# Patient Record
Sex: Female | Born: 1965 | Race: White | Hispanic: No | Marital: Married | Smoking: Never smoker
Health system: Southern US, Community
[De-identification: ages and names within clinical notes are randomized; demographics above are authoritative.]

## PROBLEM LIST (undated history)

## (undated) DIAGNOSIS — I10 Essential (primary) hypertension: Secondary | ICD-10-CM

## (undated) DIAGNOSIS — E785 Hyperlipidemia, unspecified: Secondary | ICD-10-CM

## (undated) DIAGNOSIS — E119 Type 2 diabetes mellitus without complications: Secondary | ICD-10-CM

## (undated) HISTORY — PX: CHOLECYSTECTOMY: SHX55

## (undated) HISTORY — DX: Type 2 diabetes mellitus without complications: E11.9

## (undated) HISTORY — PX: OTHER SURGICAL HISTORY: SHX169

## (undated) HISTORY — DX: Essential (primary) hypertension: I10

## (undated) HISTORY — DX: Hyperlipidemia, unspecified: E78.5

---

## 1999-10-21 ENCOUNTER — Other Ambulatory Visit: Admission: RE | Admit: 1999-10-21 | Discharge: 1999-10-21 | Payer: Self-pay | Admitting: Obstetrics and Gynecology

## 2000-11-20 ENCOUNTER — Other Ambulatory Visit: Admission: RE | Admit: 2000-11-20 | Discharge: 2000-11-20 | Payer: Self-pay | Admitting: Obstetrics and Gynecology

## 2001-09-11 ENCOUNTER — Encounter: Admission: RE | Admit: 2001-09-11 | Discharge: 2001-12-10 | Payer: Self-pay | Admitting: Obstetrics and Gynecology

## 2001-11-26 ENCOUNTER — Inpatient Hospital Stay (HOSPITAL_COMMUNITY): Admission: AD | Admit: 2001-11-26 | Discharge: 2001-11-28 | Payer: Self-pay | Admitting: Obstetrics & Gynecology

## 2001-12-24 ENCOUNTER — Other Ambulatory Visit: Admission: RE | Admit: 2001-12-24 | Discharge: 2001-12-24 | Payer: Self-pay | Admitting: Obstetrics & Gynecology

## 2002-12-31 ENCOUNTER — Other Ambulatory Visit: Admission: RE | Admit: 2002-12-31 | Discharge: 2002-12-31 | Payer: Self-pay | Admitting: Obstetrics and Gynecology

## 2004-01-06 ENCOUNTER — Other Ambulatory Visit: Admission: RE | Admit: 2004-01-06 | Discharge: 2004-01-06 | Payer: Self-pay | Admitting: Obstetrics and Gynecology

## 2004-01-21 ENCOUNTER — Other Ambulatory Visit: Admission: RE | Admit: 2004-01-21 | Discharge: 2004-01-21 | Payer: Self-pay | Admitting: Obstetrics and Gynecology

## 2004-05-12 ENCOUNTER — Other Ambulatory Visit: Admission: RE | Admit: 2004-05-12 | Discharge: 2004-05-12 | Payer: Self-pay | Admitting: Obstetrics and Gynecology

## 2005-02-01 ENCOUNTER — Other Ambulatory Visit: Admission: RE | Admit: 2005-02-01 | Discharge: 2005-02-01 | Payer: Self-pay | Admitting: Obstetrics and Gynecology

## 2006-03-18 LAB — HM MAMMOGRAPHY: HM Mammogram: NORMAL

## 2006-03-20 LAB — HM PAP SMEAR

## 2007-12-12 ENCOUNTER — Encounter: Admission: RE | Admit: 2007-12-12 | Discharge: 2007-12-21 | Payer: Self-pay | Admitting: Orthopedic Surgery

## 2010-01-18 LAB — HM DIABETES EYE EXAM

## 2011-03-21 ENCOUNTER — Encounter: Payer: Self-pay | Admitting: Physician Assistant

## 2011-03-21 DIAGNOSIS — E119 Type 2 diabetes mellitus without complications: Secondary | ICD-10-CM | POA: Insufficient documentation

## 2011-03-21 DIAGNOSIS — I10 Essential (primary) hypertension: Secondary | ICD-10-CM

## 2011-03-21 DIAGNOSIS — E785 Hyperlipidemia, unspecified: Secondary | ICD-10-CM

## 2011-05-17 ENCOUNTER — Encounter (HOSPITAL_COMMUNITY)
Admission: RE | Admit: 2011-05-17 | Discharge: 2011-05-17 | Disposition: A | Discharge status: Home / Self Care | Payer: BC Managed Care – PPO | Source: Ambulatory Visit | Attending: Obstetrics and Gynecology | Admitting: Obstetrics and Gynecology

## 2011-05-17 LAB — CBC
MCH: 27.1 pg (ref 26.0–34.0)
MCHC: 32 g/dL (ref 30.0–36.0)
MCV: 84.8 fL (ref 78.0–100.0)
Platelets: 247 10*3/uL (ref 150–400)
RBC: 4.35 MIL/uL (ref 3.87–5.11)
RDW: 13.6 % (ref 11.5–15.5)

## 2011-05-17 LAB — COMPREHENSIVE METABOLIC PANEL
ALT: 17 U/L (ref 0–35)
AST: 14 U/L (ref 0–37)
Calcium: 9.5 mg/dL (ref 8.4–10.5)
Creatinine, Ser: 0.7 mg/dL (ref 0.50–1.10)
Sodium: 136 mEq/L (ref 135–145)
Total Protein: 7.2 g/dL (ref 6.0–8.3)

## 2011-05-23 ENCOUNTER — Ambulatory Visit (HOSPITAL_COMMUNITY)
Admission: EM | Admit: 2011-05-23 | Discharge: 2011-05-23 | Disposition: A | Discharge status: Home / Self Care | Payer: BC Managed Care – PPO | Source: Ambulatory Visit | Attending: Obstetrics and Gynecology | Admitting: Obstetrics and Gynecology

## 2011-05-23 ENCOUNTER — Other Ambulatory Visit: Payer: Self-pay | Admitting: Obstetrics and Gynecology

## 2011-05-23 DIAGNOSIS — Z01812 Encounter for preprocedural laboratory examination: Secondary | ICD-10-CM | POA: Insufficient documentation

## 2011-05-23 DIAGNOSIS — Z01818 Encounter for other preprocedural examination: Secondary | ICD-10-CM | POA: Insufficient documentation

## 2011-05-23 DIAGNOSIS — N920 Excessive and frequent menstruation with regular cycle: Secondary | ICD-10-CM | POA: Insufficient documentation

## 2011-05-23 DIAGNOSIS — N840 Polyp of corpus uteri: Secondary | ICD-10-CM | POA: Insufficient documentation

## 2011-05-23 LAB — GLUCOSE, CAPILLARY
Glucose-Capillary: 156 mg/dL — ABNORMAL HIGH (ref 70–99)
Glucose-Capillary: 154 mg/dL — ABNORMAL HIGH (ref 70–99)

## 2011-06-01 NOTE — Op Note (Signed)
  [name], Debbie Hunt NO.:  [id number]  MEDICAL RECORD NO.:  [id number]  LOCATION:                                 FACILITY:  PHYSICIAN:  Malva Limes, M.D.    DATE OF BIRTH:  10-26-1966  DATE OF PROCEDURE:  05/23/2011 DATE OF DISCHARGE:                              OPERATIVE REPORT   PREOPERATIVE DIAGNOSES: 1. Uterine polyp. 2. Menorrhagia.  POSTOPERATIVE DIAGNOSES: 1. Uterine polyp. 2. Menorrhagia.  PROCEDURE: 1. Hysteroscopy. 2. Dilation and curettage. 3. NovaSure endometrial ablation.  SURGEON:  Malva Limes, MD  ANESTHESIA:  General and local.  ANTIBIOTIC:  Ancef 1 gram.  DRAINS:  Red rubber catheter bladder.  SPECIMENS:  Endometrial curettings sent to Pathology.  COMPLICATIONS:  None.  ESTIMATED BLOOD LOSS:  Minimal.  FINDINGS:  The patient had normal ostia bilaterally.  She had a large broad-based polyp on the anterior surface of the uterus.  PROCEDURE:  The patient was taken to the operating room where general anesthetic was administered without difficulty.  She was placed in dorsal lithotomy position.  She was prepped and draped in usual fashion for this procedure.  Her bladder was drained with a red rubber catheter. A sterile speculum was placed in the vagina.  A 20 mL of 1% lidocaine was used for paracervical block.  The cervix was grasped with single- tooth tenaculum and sounded to 11 cm.  The hysteroscope was advanced through the cervix and into the uterine cavity. The cavity appeared to be normal except for the large polyp on the anterior wall of the uterus. At this point, the hysteroscope was removed and sharp curettage was performed with a copious amount of tissue being removed and sent to Pathology.  Following this, the NovaSure device was placed into uterine cavity.  The width was 4.3 cm, the length 6.5 cm.  A seal test was performed and passed.  The device was then turned on for 1 minute.  The patient tolerated the  procedure well.  The device was removed.  She was awakened and taken to recovery room in stable condition.  She was discharged to home.  She was instructed to follow up in the office in 4 weeks.  She was sent home with Vicodin, __________, Diflucan for yeast infection and told to use Advil as needed.          ______________________________ Malva Limes, M.D.     MA/MEDQ  D:  05/23/2011  T:  05/23/2011  Job:  [id number]  Electronically Signed by Malva Limes M.D. on 06/01/2011 08:49:35 PM

## 2012-03-28 ENCOUNTER — Other Ambulatory Visit: Payer: Self-pay | Admitting: Obstetrics and Gynecology

## 2012-06-05 ENCOUNTER — Other Ambulatory Visit: Payer: Self-pay | Admitting: Surgery

## 2012-11-09 ENCOUNTER — Other Ambulatory Visit: Payer: Self-pay | Admitting: *Deleted

## 2012-11-09 DIAGNOSIS — R011 Cardiac murmur, unspecified: Secondary | ICD-10-CM

## 2012-11-15 ENCOUNTER — Other Ambulatory Visit (HOSPITAL_COMMUNITY): Payer: BC Managed Care – PPO

## 2013-02-26 ENCOUNTER — Telehealth: Payer: Self-pay | Admitting: Nurse Practitioner

## 2013-02-26 ENCOUNTER — Other Ambulatory Visit: Payer: Self-pay | Admitting: *Deleted

## 2013-02-26 NOTE — Telephone Encounter (Signed)
Need to know PHarmacy

## 2013-02-26 NOTE — Telephone Encounter (Signed)
Last office visit by ACM on 03-26-12. Rx last filled on 12-15-11. Please advise. Thank you

## 2013-02-27 MED ORDER — HYDROCORTISONE ACETATE 25 MG RE SUPP: 25.0000 mg | Freq: Two times a day (BID) | RECTAL | Status: DC | PRN

## 2013-02-27 NOTE — Telephone Encounter (Signed)
Pt aware and rx was [facility]

## 2013-04-02 ENCOUNTER — Other Ambulatory Visit: Payer: Self-pay | Admitting: Obstetrics and Gynecology

## 2013-04-29 ENCOUNTER — Ambulatory Visit (INDEPENDENT_AMBULATORY_CARE_PROVIDER_SITE_OTHER): Payer: BC Managed Care – PPO | Admitting: Physician Assistant

## 2013-04-29 VITALS — BP 142/80 | HR 72 | Temp 97.8°F | Ht 67.0 in | Wt 320.0 lb

## 2013-04-29 DIAGNOSIS — M549 Dorsalgia, unspecified: Secondary | ICD-10-CM

## 2013-04-29 MED ORDER — CYCLOBENZAPRINE HCL 10 MG PO TABS: 10.0000 mg | ORAL_TABLET | Freq: Three times a day (TID) | ORAL | Status: DC | PRN

## 2013-04-29 MED ORDER — MELOXICAM 15 MG PO TABS: 15.0000 mg | ORAL_TABLET | Freq: Every day | ORAL | Status: DC

## 2013-04-29 NOTE — Progress Notes (Signed)
Subjective:     Patient ID: Debbie Hunt, female   DOB: Dec 04, 1965, 47 y.o.   MRN: [id number]  HPI Pt seen as [place] pt today with a 2 week hx of L mid back pain She denies any definite injury Sx started ~ 2 weeks ago and she took some Aleeve and sx improved She had return of sx yesterday so here for review No hx of same No radiation of sx  Review of Systems  All other systems reviewed and are negative.       Objective:   Physical Exam  Nursing note and vitals reviewed. NAD Sitting comfortably No ecchy/edema seen No palp spasm Sl TTP at L lower trap Increase in sx with rotation FROM L-spine w/o sx SLR neg Muscle strength testing good     Assessment:     1. Backache        Plan:     Heat/Ice Gentle stretching Mobic/Flexeril- SE reviewed F/U prn

## 2013-04-29 NOTE — Patient Instructions (Signed)
Back Exercises Back exercises help treat and prevent back injuries. The goal of back exercises is to increase the strength of your abdominal and back muscles and the flexibility of your back. These exercises should be started when you no longer have back pain. Back exercises include:  Pelvic Tilt. Lie on your back with your knees bent. Tilt your pelvis until the lower part of your back is against the floor. Hold this position 5 to 10 sec and repeat 5 to 10 times.  Knee to Chest. Pull first 1 knee up against your chest and hold for 20 to 30 seconds, repeat this with the other knee, and then both knees. This may be done with the other leg straight or bent, whichever feels better.  Sit-Ups or Curl-Ups. Bend your knees 90 degrees. Start with tilting your pelvis, and do a partial, slow sit-up, lifting your trunk only 30 to 45 degrees off the floor. Take at least 2 to 3 seconds for each sit-up. Do not do sit-ups with your knees out straight. If partial sit-ups are difficult, simply do the above but with only tightening your abdominal muscles and holding it as directed.  Hip-Lift. Lie on your back with your knees flexed 90 degrees. Push down with your feet and shoulders as you raise your hips a couple inches off the floor; hold for 10 seconds, repeat 5 to 10 times.  Back arches. Lie on your stomach, propping yourself up on bent elbows. Slowly press on your hands, causing an arch in your low back. Repeat 3 to 5 times. Any initial stiffness and discomfort should lessen with repetition over time.  Shoulder-Lifts. Lie face down with arms beside your body. Keep hips and torso pressed to floor as you slowly lift your head and shoulders off the floor. Do not overdo your exercises, especially in the beginning. Exercises may cause you some mild back discomfort which lasts for a few minutes; however, if the pain is more severe, or lasts for more than 15 minutes, do not continue exercises until you see your caregiver.  Improvement with exercise therapy for back problems is slow.  See your caregivers for assistance with developing a proper back exercise program. Document Released: 12/15/2004 Document Revised: 01/30/2012 Document Reviewed: 09/08/2011 [employer] Patient Information 2014 [employer].  

## 2013-09-02 ENCOUNTER — Ambulatory Visit (INDEPENDENT_AMBULATORY_CARE_PROVIDER_SITE_OTHER): Payer: BC Managed Care – PPO

## 2013-09-02 DIAGNOSIS — Z23 Encounter for immunization: Secondary | ICD-10-CM

## 2013-11-26 ENCOUNTER — Encounter: Payer: Self-pay | Admitting: Family Medicine

## 2013-11-26 ENCOUNTER — Ambulatory Visit (INDEPENDENT_AMBULATORY_CARE_PROVIDER_SITE_OTHER): Payer: BC Managed Care – PPO | Admitting: Family Medicine

## 2013-11-26 ENCOUNTER — Other Ambulatory Visit: Payer: Self-pay | Admitting: *Deleted

## 2013-11-26 VITALS — BP 125/83 | HR 85 | Temp 96.6°F | Ht 67.0 in | Wt 312.0 lb

## 2013-11-26 DIAGNOSIS — K649 Unspecified hemorrhoids: Secondary | ICD-10-CM

## 2013-11-26 DIAGNOSIS — R5381 Other malaise: Secondary | ICD-10-CM

## 2013-11-26 DIAGNOSIS — I10 Essential (primary) hypertension: Secondary | ICD-10-CM

## 2013-11-26 DIAGNOSIS — E119 Type 2 diabetes mellitus without complications: Secondary | ICD-10-CM

## 2013-11-26 DIAGNOSIS — R5383 Other fatigue: Secondary | ICD-10-CM

## 2013-11-26 LAB — POCT CBC
Granulocyte percent: 66.9 %G (ref 37–80)
HCT, POC: 40 % (ref 37.7–47.9)
Hemoglobin: 12.6 g/dL (ref 12.2–16.2)
Lymph, poc: 1.8 (ref 0.6–3.4)
MCH, POC: 26.2 pg — AB (ref 27–31.2)
MCHC: 31.5 g/dL — AB (ref 31.8–35.4)
MCV: 83.1 fL (ref 80–97)
MPV: 8.3 fL (ref 0–99.8)
POC Granulocyte: 4.1 (ref 2–6.9)
POC LYMPH PERCENT: 29.8 %L (ref 10–50)
Platelet Count, POC: 237 10*3/uL (ref 142–424)
RBC: 4.8 M/uL (ref 4.04–5.48)
RDW, POC: 14.1 %
WBC: 6.2 10*3/uL (ref 4.6–10.2)

## 2013-11-26 LAB — POCT GLYCOSYLATED HEMOGLOBIN (HGB A1C): Hemoglobin A1C: 7.5

## 2013-11-26 MED ORDER — LISINOPRIL-HYDROCHLOROTHIAZIDE 20-12.5 MG PO TABS: 1.0000 | ORAL_TABLET | Freq: Every day | ORAL | Status: DC

## 2013-11-26 MED ORDER — METFORMIN HCL 500 MG PO TABS: 500.0000 mg | ORAL_TABLET | Freq: Two times a day (BID) | ORAL | Status: DC

## 2013-11-26 MED ORDER — HYDROCORTISONE ACETATE 25 MG RE SUPP: 25.0000 mg | Freq: Two times a day (BID) | RECTAL | Status: DC | PRN

## 2013-11-26 NOTE — Progress Notes (Signed)
   Subjective:    Patient ID: Benito Mccreedy, female    DOB: 1966/10/24, 48 y.o.   MRN: [id number]  HPI This 48 y.o. female presents for evaluation of hypertension, diabetes, and hyperlipidemia. She is up to date with her mammo and pap smear.  She has had flu shot.  She has Been having some right ear discomfort and her hearing in this ear has been muffled. She has trouble with hemorrhoids occasionally and wants a refill on anusol-HC.   Review of Systems C/o right ear discomfort No chest pain, SOB, HA, dizziness, vision change, N/V, diarrhea, constipation, dysuria, urinary urgency or frequency, myalgias, arthralgias or rash.     Objective:   Physical Exam  Vital signs noted  Well developed well nourished female.  HEENT - Head atraumatic Normocephalic                Eyes - PERRLA, Conjuctiva - clear Sclera- Clear EOMI                Ears - EAC's Wnl TM's Wnl Gross Hearing WNL                Throat - oropharanx wnl Respiratory - Lungs CTA bilateral Cardiac - RRR S1 and S2 without murmur GI - Abdomen soft Nontender and bowel sounds active x 4 Extremities - No edema. Neuro - Grossly intact.      Assessment & Plan:  Hypertension - Plan: POCT CBC, lisinopril-hydrochlorothiazide (PRINZIDE,ZESTORETIC) 20-12.5 MG per tablet  Diabetes mellitus, type 2 - Plan: POCT glycosylated hemoglobin (Hb A1C), CMP14+EGFR, Lipid panel, Microalbumin, urine, metFORMIN (GLUCOPHAGE) 500 MG tablet  Fatigue - Plan: POCT CBC, Thyroid Panel With TSH, Vit D  25 hydroxy (rtn osteoporosis monitoring)  Hemorrhoids - Plan: hydrocortisone (ANUSOL-HC) 25 MG suppository.  ETD - OTC antihistamines and use flonase at home.  Lysbeth Penner FNP

## 2013-11-27 LAB — LIPID PANEL
Chol/HDL Ratio: 3.5 ratio units (ref 0.0–4.4)
Cholesterol, Total: 132 mg/dL (ref 100–199)
HDL: 38 mg/dL — ABNORMAL LOW (ref >39)
LDL Calculated: 64 mg/dL (ref 0–99)
Triglycerides: 151 mg/dL — ABNORMAL HIGH (ref 0–149)
VLDL Cholesterol Cal: 30 mg/dL (ref 5–40)

## 2013-11-27 LAB — CMP14+EGFR
ALT: 22 IU/L (ref 0–32)
AST: 16 IU/L (ref 0–40)
Albumin/Globulin Ratio: 1.8 (ref 1.1–2.5)
Albumin: 4.1 g/dL (ref 3.5–5.5)
Alkaline Phosphatase: 71 IU/L (ref 39–117)
BUN/Creatinine Ratio: 15 (ref 9–23)
BUN: 11 mg/dL (ref 6–24)
CO2: 24 mmol/L (ref 18–29)
Calcium: 9.3 mg/dL (ref 8.7–10.2)
Chloride: 98 mmol/L (ref 97–108)
Creatinine, Ser: 0.74 mg/dL (ref 0.57–1.00)
GFR calc Af Amer: 112 mL/min/{1.73_m2} (ref >59)
GFR calc non Af Amer: 97 mL/min/{1.73_m2} (ref >59)
Globulin, Total: 2.3 g/dL (ref 1.5–4.5)
Glucose: 171 mg/dL — ABNORMAL HIGH (ref 65–99)
Potassium: 4.7 mmol/L (ref 3.5–5.2)
Sodium: 138 mmol/L (ref 134–144)
Total Bilirubin: 0.5 mg/dL (ref 0.0–1.2)
Total Protein: 6.4 g/dL (ref 6.0–8.5)

## 2013-11-27 LAB — THYROID PANEL WITH TSH
Free Thyroxine Index: 2.3 (ref 1.2–4.9)
T3 Uptake Ratio: 26 % (ref 24–39)
T4, Total: 8.8 ug/dL (ref 4.5–12.0)
TSH: 2.99 u[IU]/mL (ref 0.450–4.500)

## 2013-11-27 LAB — VITAMIN D 25 HYDROXY (VIT D DEFICIENCY, FRACTURES): Vit D, 25-Hydroxy: 27.2 ng/mL — ABNORMAL LOW (ref 30.0–100.0)

## 2013-11-27 LAB — MICROALBUMIN, URINE: Microalbumin, Urine: <3 ug/mL (ref 0.0–17.0)

## 2013-11-28 ENCOUNTER — Other Ambulatory Visit: Payer: Self-pay | Admitting: Family Medicine

## 2013-11-28 ENCOUNTER — Telehealth: Payer: Self-pay | Admitting: Family Medicine

## 2013-11-28 MED ORDER — VITAMIN D (ERGOCALCIFEROL) 1.25 MG (50000 UNIT) PO CAPS: 50000.0000 [IU] | ORAL_CAPSULE | ORAL | Status: DC

## 2013-11-28 NOTE — Telephone Encounter (Signed)
Message copied by Azalee CourseFULP, ASHLEY on Thu Nov 28, 2013 11:43 AM ------      Message from: Deatra CanterXFORD, WILLIAM J      Created: Thu Nov 28, 2013 10:07 AM       HGBAIC is elevated at 7.5% and would recommend diet and exercise. She can also follow up with Gustav[facility] Bryantammy Eckard Pharm D for diabetes visit.  She has low vitamin D and have called in a Vitamin D rx      And would recommend taking vit d 1000 iu po qd otc daily ------

## 2013-12-04 ENCOUNTER — Telehealth: Payer: Self-pay | Admitting: Family Medicine

## 2013-12-04 ENCOUNTER — Other Ambulatory Visit: Payer: Self-pay

## 2013-12-04 MED ORDER — METFORMIN HCL 1000 MG PO TABS: 1000.0000 mg | ORAL_TABLET | Freq: Two times a day (BID) | ORAL | Status: DC

## 2013-12-04 NOTE — Addendum Note (Signed)
Addended by: Georgann HousekeeperBOLES, Eveny Anastas R on: 12/04/2013 12:00 PM   Modules accepted: Orders, Medications

## 2014-04-07 ENCOUNTER — Other Ambulatory Visit: Payer: Self-pay | Admitting: Obstetrics and Gynecology

## 2014-08-27 ENCOUNTER — Ambulatory Visit (INDEPENDENT_AMBULATORY_CARE_PROVIDER_SITE_OTHER): Payer: BC Managed Care – PPO

## 2014-08-27 DIAGNOSIS — Z23 Encounter for immunization: Secondary | ICD-10-CM

## 2014-10-02 ENCOUNTER — Ambulatory Visit (INDEPENDENT_AMBULATORY_CARE_PROVIDER_SITE_OTHER): Payer: BC Managed Care – PPO | Admitting: Family Medicine

## 2014-10-02 ENCOUNTER — Encounter: Payer: Self-pay | Admitting: Family Medicine

## 2014-10-02 ENCOUNTER — Encounter (INDEPENDENT_AMBULATORY_CARE_PROVIDER_SITE_OTHER): Payer: Self-pay

## 2014-10-02 ENCOUNTER — Telehealth: Payer: Self-pay | Admitting: Family Medicine

## 2014-10-02 ENCOUNTER — Encounter: Payer: Self-pay | Admitting: *Deleted

## 2014-10-02 VITALS — BP 130/87 | HR 100 | Temp 99.0°F | Ht 67.0 in | Wt 301.0 lb

## 2014-10-02 DIAGNOSIS — J029 Acute pharyngitis, unspecified: Secondary | ICD-10-CM

## 2014-10-02 DIAGNOSIS — R509 Fever, unspecified: Secondary | ICD-10-CM

## 2014-10-02 DIAGNOSIS — A499 Bacterial infection, unspecified: Secondary | ICD-10-CM

## 2014-10-02 DIAGNOSIS — H1089 Other conjunctivitis: Secondary | ICD-10-CM

## 2014-10-02 DIAGNOSIS — H109 Unspecified conjunctivitis: Secondary | ICD-10-CM

## 2014-10-02 DIAGNOSIS — R52 Pain, unspecified: Secondary | ICD-10-CM

## 2014-10-02 DIAGNOSIS — B349 Viral infection, unspecified: Secondary | ICD-10-CM

## 2014-10-02 LAB — POCT RAPID STREP A (OFFICE): Rapid Strep A Screen: NEGATIVE

## 2014-10-02 LAB — POCT INFLUENZA A/B
INFLUENZA B, POC: NEGATIVE
Influenza A, POC: NEGATIVE

## 2014-10-02 MED ORDER — SULFACETAMIDE SODIUM 10 % OP SOLN: 1.0000 [drp] | Freq: Four times a day (QID) | OPHTHALMIC | Status: DC

## 2014-10-02 NOTE — Progress Notes (Signed)
Subjective:    Patient ID: Debbie Hunt, female    DOB: 12/08/1965, 48 y.o.   MRN: [id number]  HPI Patient here today for flu like symptoms that started Tuesday. Patient is currently on Zpak from dentist- this was given for right side sinus.        Patient Active Problem List   Diagnosis Date Noted  . DM (diabetes mellitus) 03/21/2011  . HTN (hypertension) 03/21/2011  . Obesity, Class III, BMI 40-49.9 (morbid obesity) 03/21/2011  . Dyslipidemia 03/21/2011   Outpatient Encounter Prescriptions as of 10/02/2014  Medication Sig  . hydrocortisone (ANUSOL-HC) 25 MG suppository Place 1 suppository (25 mg total) rectally 2 (two) times daily as needed for hemorrhoids.  Marland Kitchen. lisinopril-hydrochlorothiazide (PRINZIDE,ZESTORETIC) 20-12.5 MG per tablet Take 1 tablet by mouth daily.  . metFORMIN (GLUCOPHAGE) 1000 MG tablet Take 1 tablet (1,000 mg total) by mouth 2 (two) times daily with a meal.  . Vitamin D, Ergocalciferol, (DRISDOL) 50000 UNITS CAPS capsule Take 1 capsule (50,000 Units total) by mouth every 7 (seven) days.    Review of Systems  Constitutional: Positive for fever.  HENT: Positive for congestion, ear pain (right), postnasal drip and sore throat.   Eyes: Negative.   Respiratory: Positive for cough.   Cardiovascular: Negative.   Gastrointestinal: Negative.   Endocrine: Negative.   Genitourinary: Negative.   Musculoskeletal: Positive for myalgias.  Skin: Negative.   Allergic/Immunologic: Negative.   Neurological: Positive for headaches.  Hematological: Negative.   Psychiatric/Behavioral: Negative.        Objective:   Physical Exam  Constitutional: She is oriented to person, place, and time. She appears well-developed and well-nourished. No distress.  HENT:  Head: Normocephalic and atraumatic.  Right Ear: External ear normal.  Left Ear: External ear normal.  Nose: Nose normal.  Mouth/Throat: No oropharyngeal exudate.  The throat is somewhat red posteriorly    Eyes: Conjunctivae and EOM are normal. Pupils are equal, round, and reactive to light. Right eye exhibits discharge (the right eye isred and somewhat swollen). Left eye exhibits no discharge. No scleral icterus.  Neck: Normal range of motion. Neck supple. No JVD present. No thyromegaly present.  No anterior cervical nodes  Cardiovascular: Normal rate, regular rhythm and normal heart sounds.   No murmur heard. Pulmonary/Chest: Effort normal and breath sounds normal. No respiratory distress. She has no wheezes. She has no rales. She exhibits no tenderness.  The lungs are clear anteriorly and posteriorly  Abdominal: She exhibits no mass.  Musculoskeletal: Normal range of motion. She exhibits no edema.  Lymphadenopathy:    She has no cervical adenopathy.  Neurological: She is alert and oriented to person, place, and time.  Skin: Skin is warm and dry.  Psychiatric: She has a normal mood and affect. Her behavior is normal. Judgment and thought content normal.  Nursing note and vitals reviewed.   BP 130/87 mmHg  Pulse 100  Temp(Src) 99 F (37.2 C) (Oral)  Ht 5\' 7"  (1.702 m)  Wt 301 lb (136.533 kg)  BMI 47.13 kg/m2  LMP 09/26/2014  Results for orders placed or performed in visit on 10/02/14  POCT rapid strep A  Result Value Ref Range   Rapid Strep A Screen Negative Negative  POCT Influenza A/B  Result Value Ref Range   Influenza A, POC Negative    Influenza B, POC Negative    The patient was informed of the above results before she left the office.      Assessment & Plan:  1.  Fever, unspecified fever cause - POCT rapid strep A - POCT Influenza A/B - Strep A culture, throat  2. Sore throat - POCT rapid strep A - POCT Influenza A/B - Strep A culture, throat  3. Body aches - POCT rapid strep A - POCT Influenza A/B - Strep A culture, throat  4. Bacterial conjunctivitis - sulfacetamide (BLEPH-10) 10 % ophthalmic solution; Place 1 drop into both eyes 4 (four) times daily.  Use for 7-10 days as directed  Dispense: 15 mL; Refill: 0  5. Viral syndrome - sulfacetamide (BLEPH-10) 10 % ophthalmic solution; Place 1 drop into both eyes 4 (four) times daily. Use for 7-10 days as directed  Dispense: 15 mL; Refill: 0  Patient Instructions  Drink plenty of fluids Take Tylenol for aches pains and fever Continue to take the Z-Pak that you are already on Use the eyedrops as directed Wash hands regularly Take Mucinex maximum strength, blue and white in color, 1 twice daily for cough and congestion with a large glass of water Use saline nose spray as directed   Nyra Capeson W. Arlissa Monteverde MD

## 2014-10-02 NOTE — Patient Instructions (Signed)
Drink plenty of fluids Take Tylenol for aches pains and fever Continue to take the Z-Pak that you are already on Use the eyedrops as directed Wash hands regularly Take Mucinex maximum strength, blue and white in color, 1 twice daily for cough and congestion with a large glass of water Use saline nose spray as directed

## 2014-10-02 NOTE — Telephone Encounter (Signed)
appt given today per patient request

## 2014-10-04 ENCOUNTER — Encounter: Payer: Self-pay | Admitting: Family Medicine

## 2014-10-04 ENCOUNTER — Other Ambulatory Visit (HOSPITAL_COMMUNITY): Payer: BC Managed Care – PPO

## 2014-10-04 ENCOUNTER — Ambulatory Visit (HOSPITAL_COMMUNITY): Payer: BC Managed Care – PPO

## 2014-10-04 ENCOUNTER — Ambulatory Visit (INDEPENDENT_AMBULATORY_CARE_PROVIDER_SITE_OTHER): Payer: BC Managed Care – PPO | Admitting: Family Medicine

## 2014-10-04 VITALS — BP 125/78 | HR 97 | Temp 98.0°F | Wt 298.0 lb

## 2014-10-04 DIAGNOSIS — J0120 Acute ethmoidal sinusitis, unspecified: Secondary | ICD-10-CM

## 2014-10-04 DIAGNOSIS — R519 Headache, unspecified: Secondary | ICD-10-CM

## 2014-10-04 DIAGNOSIS — R51 Headache: Secondary | ICD-10-CM

## 2014-10-04 DIAGNOSIS — H109 Unspecified conjunctivitis: Secondary | ICD-10-CM

## 2014-10-04 LAB — POCT CBC
GRANULOCYTE PERCENT: 79.1 % (ref 37–80)
HEMATOCRIT: 39.6 % (ref 37.7–47.9)
HEMOGLOBIN: 13 g/dL (ref 12.2–16.2)
LYMPH, POC: 1.1 (ref 0.6–3.4)
MCH: 26.9 pg — AB (ref 27–31.2)
MCHC: 32.7 g/dL (ref 31.8–35.4)
MCV: 82.3 fL (ref 80–97)
MPV: 7.4 fL (ref 0–99.8)
PLATELET COUNT, POC: 217 10*3/uL (ref 142–424)
POC Granulocyte: 4.5 (ref 2–6.9)
POC LYMPH PERCENT: 18.5 %L (ref 10–50)
RBC: 4.8 M/uL (ref 4.04–5.48)
RDW, POC: 13.9 %
WBC: 5.7 10*3/uL (ref 4.6–10.2)

## 2014-10-04 MED ORDER — BENZONATATE 100 MG PO CAPS: 100.0000 mg | ORAL_CAPSULE | Freq: Two times a day (BID) | ORAL | Status: DC | PRN

## 2014-10-04 MED ORDER — LEVOFLOXACIN 500 MG PO TABS: 500.0000 mg | ORAL_TABLET | Freq: Every day | ORAL | Status: DC

## 2014-10-04 NOTE — Patient Instructions (Signed)
Continue to drink plenty of fluids Take antibiotic as directed Use saline nose spray frequently through the day Use Tessalon Perles as needed for cough Get CT scan of sinuses--- hopefully sometime today

## 2014-10-04 NOTE — Progress Notes (Signed)
Subjective:    Patient ID: Debbie Hunt, female    DOB: 12/08/1965, 48 y.o.   MRN: [id number]  HPI Pt is here today for continued right eye redness, cough and fever.  She finished the Z pac yesterday and is no better. A she comes to the visit today with her husband. She had seen the dentist recently and he told her the area above her teeth on the right side was opacified. She also continues with a cough that is very aggravated.         Review of Systems  Constitutional: Positive for fever (continued).  Eyes: Positive for pain (Right, worsening), discharge (continued, Right) and redness (Right, continued).  Respiratory: Positive for cough (productive).    .    Objective:   Physical Exam  Constitutional: She is oriented to person, place, and time. She appears well-developed and well-nourished. She appears distressed.  HENT:  Head: Normocephalic and atraumatic.  Right Ear: External ear normal.  Left Ear: External ear normal.  Mouth/Throat: No oropharyngeal exudate.  Progress slightly red posteriorly  Eyes: Conjunctivae and EOM are normal. Pupils are equal, round, and reactive to light. Right eye exhibits discharge (minimal discharge). Left eye exhibits no discharge. No scleral icterus.  Conjunctival redness and periorbital swelling right eye. Ears also right maxillary and ethmoid tenderness  Neck: Normal range of motion. Neck supple. No thyromegaly present.  Cardiovascular: Normal rate, regular rhythm and normal heart sounds.   No murmur heard. Pulmonary/Chest: Effort normal and breath sounds normal. No respiratory distress. She has no wheezes. She has no rales. She exhibits no tenderness.  Abdominal: Soft. Bowel sounds are normal.  Musculoskeletal: Normal range of motion.  Lymphadenopathy:    She has no cervical adenopathy.  Neurological: She is alert and oriented to person, place, and time.  Skin: Skin is warm and dry. No rash noted.  Patient is clammy and somewhat  sweaty.  Psychiatric: She has a normal mood and affect. Her behavior is normal. Judgment and thought content normal.  Nursing note and vitals reviewed.   BP 125/78 mmHg  Pulse 97  Temp(Src) 98 F (36.7 C) (Oral)  Wt 298 lb (135.172 kg)  LMP 09/26/2014  The patient and her husband were informed of the CBC results for the left office We have arranged to get a CT of the head and sinuses and this will be done this morning and we will discuss the results with the patient after this is obtained      Assessment & Plan:   1. Periorbital headache - CT Maxillofacial W/Cm - POCT CBC; Standing - BMP8+EGFR - Aerobic culture - CT Head Wo Contrast - Creatinine, serum - POCT CBC  2. Conjunctivitis of right eye - CT Maxillofacial W/Cm - POCT CBC; Standing - BMP8+EGFR - Aerobic culture - CT Head Wo Contrast - Creatinine, serum - POCT CBC  3. Acute ethmoidal sinusitis, recurrence not specified - CT Head Wo Contrast - Creatinine, serum  Patient Instructions  Continue to drink plenty of fluids Take antibiotic as directed Use saline nose spray frequently through the day Use Tessalon Perles as needed for cough Get CT scan of sinuses--- hopefully sometime today   Arrie Senate MD   The patient has waited for a good hour trying to get insurance to pre-certify the CT scan which I think is warranted of her sinuses and head especially because she is a diabetic has periorbital redness swelling and conjunctivitis. We'll let the patient go home as we  continue to work on this CT scan approval.  Arrie Senate MD

## 2014-10-05 LAB — BMP8+EGFR
BUN/Creatinine Ratio: 11 (ref 9–23)
BUN: 7 mg/dL (ref 6–24)
CALCIUM: 9.2 mg/dL (ref 8.7–10.2)
CO2: 24 mmol/L (ref 18–29)
CREATININE: 0.66 mg/dL (ref 0.57–1.00)
Chloride: 96 mmol/L — ABNORMAL LOW (ref 97–108)
GFR calc Af Amer: 122 mL/min/{1.73_m2} (ref >59)
GFR, EST NON AFRICAN AMERICAN: 106 mL/min/{1.73_m2} (ref >59)
Glucose: 270 mg/dL — ABNORMAL HIGH (ref 65–99)
Potassium: 4 mmol/L (ref 3.5–5.2)
SODIUM: 138 mmol/L (ref 134–144)

## 2014-10-05 LAB — CREATININE, SERUM
CREATININE: 0.62 mg/dL (ref 0.57–1.00)
GFR calc Af Amer: 124 mL/min/{1.73_m2} (ref >59)
GFR, EST NON AFRICAN AMERICAN: 108 mL/min/{1.73_m2} (ref >59)

## 2014-10-05 LAB — STREP A CULTURE, THROAT: STREP A CULTURE: NEGATIVE

## 2014-10-07 ENCOUNTER — Telehealth: Payer: Self-pay | Admitting: Family Medicine

## 2014-10-07 NOTE — Telephone Encounter (Signed)
Pt states she feels "a 1000 times better" Her eye is the only thing that is still bothering her- it is not completely better. Culture is not back at this time - on the eye.  She is aware of all other labs

## 2014-10-07 NOTE — Telephone Encounter (Signed)
Continue eyedrops until the culture is returned Please call back in the morning

## 2014-10-08 LAB — AEROBIC CULTURE

## 2014-10-10 ENCOUNTER — Telehealth: Payer: Self-pay

## 2014-10-10 NOTE — Telephone Encounter (Signed)
Pt aware of culture; Her eye is clearing up and has had no issues with the other eye at all. Will call us if doesn't resolve completely

## 2014-10-10 NOTE — Telephone Encounter (Signed)
-----   Message from [facility] CorpJamie Hundley LititzBullins, LPN sent at [id number]/19/2015 10:49 AM EST -----   ----- Message -----    From: Ernestina Pennaonald W Moore, MD    Sent: 10/08/2014   9:17 PM      To: Almond LintJamie Hundley Bullins, LPN, #  There was no growth on the culture taken from the eye. Please find out how the infection in the eye is doing++++++++

## 2014-10-19 ENCOUNTER — Other Ambulatory Visit: Payer: Self-pay | Admitting: Family Medicine

## 2014-10-20 NOTE — Telephone Encounter (Signed)
No A1C since 11/2013

## 2014-12-09 LAB — HM DIABETES EYE EXAM

## 2015-01-01 ENCOUNTER — Other Ambulatory Visit: Payer: Self-pay | Admitting: Family Medicine

## 2015-01-02 ENCOUNTER — Telehealth: Payer: Self-pay | Admitting: Family Medicine

## 2015-01-02 NOTE — Telephone Encounter (Signed)
Please refill all of her prescriptions and give her an appointment for routine follow-up

## 2015-01-06 NOTE — Telephone Encounter (Signed)
Patient aware that she will need to schedule and appointment her last routine follow up with routine labs were 11/2014.

## 2015-01-07 ENCOUNTER — Other Ambulatory Visit: Payer: Self-pay | Admitting: Family Medicine

## 2015-03-31 ENCOUNTER — Other Ambulatory Visit: Payer: Self-pay | Admitting: Family Medicine

## 2015-04-06 ENCOUNTER — Other Ambulatory Visit: Payer: Self-pay | Admitting: Family Medicine

## 2015-04-28 ENCOUNTER — Other Ambulatory Visit: Payer: Self-pay | Admitting: Obstetrics and Gynecology

## 2015-04-29 LAB — CYTOLOGY - PAP

## 2015-04-30 LAB — HM MAMMOGRAPHY: HM Mammogram: NEGATIVE

## 2015-06-28 ENCOUNTER — Other Ambulatory Visit: Payer: Self-pay | Admitting: Family Medicine

## 2015-07-04 ENCOUNTER — Other Ambulatory Visit: Payer: Self-pay | Admitting: Family Medicine

## 2015-07-04 NOTE — Telephone Encounter (Signed)
Last seen 10/04/14  DwM   Requesting 90 day supply

## 2015-07-05 NOTE — Telephone Encounter (Signed)
The skilled patient an appointment to be seen by a provider since it has been over 6 months since she was seen. This prescription may also be refilled

## 2015-07-13 ENCOUNTER — Ambulatory Visit: Payer: Self-pay | Admitting: Family Medicine

## 2015-07-14 ENCOUNTER — Ambulatory Visit (INDEPENDENT_AMBULATORY_CARE_PROVIDER_SITE_OTHER): Payer: BLUE CROSS/BLUE SHIELD | Admitting: Family Medicine

## 2015-07-14 ENCOUNTER — Encounter: Payer: Self-pay | Admitting: Family Medicine

## 2015-07-14 VITALS — BP 139/85 | HR 72 | Temp 96.8°F | Ht 67.0 in | Wt 298.4 lb

## 2015-07-14 DIAGNOSIS — K649 Unspecified hemorrhoids: Secondary | ICD-10-CM

## 2015-07-14 DIAGNOSIS — I10 Essential (primary) hypertension: Secondary | ICD-10-CM

## 2015-07-14 DIAGNOSIS — E119 Type 2 diabetes mellitus without complications: Secondary | ICD-10-CM

## 2015-07-14 DIAGNOSIS — M79644 Pain in right finger(s): Secondary | ICD-10-CM

## 2015-07-14 DIAGNOSIS — M79646 Pain in unspecified finger(s): Secondary | ICD-10-CM | POA: Insufficient documentation

## 2015-07-14 LAB — POCT GLYCOSYLATED HEMOGLOBIN (HGB A1C): Hemoglobin A1C: 7.3

## 2015-07-14 MED ORDER — HYDROCORTISONE ACETATE 25 MG RE SUPP: 25.0000 mg | Freq: Two times a day (BID) | RECTAL | Status: DC | PRN

## 2015-07-14 MED ORDER — LISINOPRIL-HYDROCHLOROTHIAZIDE 20-12.5 MG PO TABS: 1.0000 | ORAL_TABLET | Freq: Every day | ORAL | Status: DC

## 2015-07-14 MED ORDER — METFORMIN HCL 1000 MG PO TABS
1000.0000 mg | ORAL_TABLET | Freq: Two times a day (BID) | ORAL | Status: DC
Start: 2015-07-14 — End: 2016-07-09

## 2015-07-14 NOTE — Progress Notes (Signed)
Patient ID: Debbie Hunt, female   DOB: February 03, 1966, 49 y.o.   MRN: [id number]   HPI  Patient presents today to follow-up for chronic medical conditions  Hypertension Good medication compliance No chest pain, dyspnea, palpitations, leg edema Exercising daily, about 30 minutes on her recumbent bike, also now beginning to go to the gym Watching her diet, primarily limiting carbohydrates  Diabetes Good compliance Average fasting blood sugars are 130s Tolerating metformin well No foot or hand numbness Watching diet and exercising as above  Thumb pain Right greater than left, however it is bilateral Described as interphalangeal soreness with activity, also with morning stiffness. She did have some slight swelling of the right interphalangeal joint on her thumb yesterday but no erythema, warmth, or other swollen joints. Not much pain proximal to the thumb but some tenderness with palpation No injury or trauma  PMH: Smoking status noted ROS: Per HPI  Objective: BP 139/85 mmHg  Pulse 72  Temp(Src) 96.8 F (36 C) (Oral)  Ht _0  (1.702 m)  Wt 298 lb 6.4 oz (135.353 kg)  BMI 46.72 kg/m2 Gen: NAD, alert, cooperative with exam HEENT: NCAT CV: RRR, good S1/S2,  quiet 1/6 systolic murmur Resp: CTABL, no wheezes, non-labored Ext: No edema, warm Neuro: Alert and oriented, No gross deficits Msk: Tenderness to palpation of either interphalangeal or metacarpophalangeal joints of her thumbs, no erythema, warmth, or swelling   Diabetic foot exam: 2+ DP pulses bilaterally, no lesions, sensation intact to monofilament throughout  Assessment and plan:  HTN (hypertension) Well-controlled Continue Prinzide Labs  Obesity, Class III, BMI 40-49.9 (morbid obesity) She's been aggressive on her diet and exercise changes and is going even more aggressive on the exercise Congratulated Continue to follow  DM (diabetes mellitus) A1c 7.3, fasting blood sugars reasonable 130s Labs Continue  metformin 1 g twice a day Likely start statin when she turns 51 Foot exam normal today  Thumb pain Right slightly worse than the left, however it is bilateral Some resemblance of dequervain's tenosynovitis but this has not completely consistent with her presentation Reassurance, consider inflammatory arthritis with morning stiffness Watchful waiting and conservative therapy including ice, heat, Tylenol   Orders Placed This Encounter  Procedures  . Lipid panel  . CMP14+EGFR  . CBC with Differential/Platelet  . POCT glycosylated hemoglobin (Hb A1C)    Meds ordered this encounter  Medications  . hydrocortisone (ANUSOL-HC) 25 MG suppository    Sig: Place 1 suppository (25 mg total) rectally 2 (two) times daily as needed for hemorrhoids.    Dispense:  12 suppository    Refill:  5  . lisinopril-hydrochlorothiazide (PRINZIDE,ZESTORETIC) 20-12.5 MG per tablet    Sig: Take 1 tablet by mouth daily.    Dispense:  90 tablet    Refill:  3  . metFORMIN (GLUCOPHAGE) 1000 MG tablet    Sig: Take 1 tablet (1,000 mg total) by mouth 2 (two) times daily with a meal.    Dispense:  180 tablet    Refill:  [place], MD [facility] 07/14/2015, 8:34 AM

## 2015-07-14 NOTE — Assessment & Plan Note (Signed)
A1c 7.3, fasting blood sugars reasonable 130s Labs Continue metformin 1 g twice a day Likely start statin when she turns 51 Foot exam normal today

## 2015-07-14 NOTE — Patient Instructions (Signed)
Great to meet you!  You are doing very good with your diet and exercise, congratulations!  Come back in 3 months  We will call you with your labs within a week.

## 2015-07-14 NOTE — Assessment & Plan Note (Signed)
She's been aggressive on her diet and exercise changes and is going even more aggressive on the exercise Congratulated Continue to follow

## 2015-07-14 NOTE — Assessment & Plan Note (Signed)
Right slightly worse than the left, however it is bilateral Some resemblance of dequervain's tenosynovitis but this has not completely consistent with her presentation Reassurance, consider inflammatory arthritis with morning stiffness Watchful waiting and conservative therapy including ice, heat, Tylenol

## 2015-07-14 NOTE — Assessment & Plan Note (Signed)
Well-controlled Continue Prinzide Labs

## 2015-07-15 LAB — CMP14+EGFR
ALK PHOS: 73 IU/L (ref 39–117)
ALT: 11 IU/L (ref 0–32)
AST: 10 IU/L (ref 0–40)
Albumin/Globulin Ratio: 1.7 (ref 1.1–2.5)
Albumin: 4 g/dL (ref 3.5–5.5)
BUN/Creatinine Ratio: 17 (ref 9–23)
BUN: 12 mg/dL (ref 6–24)
Bilirubin Total: 0.4 mg/dL (ref 0.0–1.2)
CO2: 22 mmol/L (ref 18–29)
Calcium: 9.3 mg/dL (ref 8.7–10.2)
Chloride: 96 mmol/L — ABNORMAL LOW (ref 97–108)
Creatinine, Ser: 0.71 mg/dL (ref 0.57–1.00)
GFR calc Af Amer: 117 mL/min/{1.73_m2} (ref >59)
GFR calc non Af Amer: 102 mL/min/{1.73_m2} (ref >59)
GLOBULIN, TOTAL: 2.3 g/dL (ref 1.5–4.5)
Glucose: 151 mg/dL — ABNORMAL HIGH (ref 65–99)
POTASSIUM: 4.7 mmol/L (ref 3.5–5.2)
SODIUM: 136 mmol/L (ref 134–144)
Total Protein: 6.3 g/dL (ref 6.0–8.5)

## 2015-07-15 LAB — CBC WITH DIFFERENTIAL/PLATELET
Basophils Absolute: 0 10*3/uL (ref 0.0–0.2)
Basos: 0 %
EOS (ABSOLUTE): 0.1 10*3/uL (ref 0.0–0.4)
Eos: 2 %
Hematocrit: 38.5 % (ref 34.0–46.6)
Hemoglobin: 12.5 g/dL (ref 11.1–15.9)
IMMATURE GRANULOCYTES: 0 %
Immature Grans (Abs): 0 10*3/uL (ref 0.0–0.1)
LYMPHS: 30 %
Lymphocytes Absolute: 1.8 10*3/uL (ref 0.7–3.1)
MCH: 28.4 pg (ref 26.6–33.0)
MCHC: 32.5 g/dL (ref 31.5–35.7)
MCV: 88 fL (ref 79–97)
MONOS ABS: 0.3 10*3/uL (ref 0.1–0.9)
Monocytes: 5 %
Neutrophils Absolute: 3.8 10*3/uL (ref 1.4–7.0)
Neutrophils: 63 %
PLATELETS: 225 10*3/uL (ref 150–379)
RBC: 4.4 x10E6/uL (ref 3.77–5.28)
RDW: 14.4 % (ref 12.3–15.4)
WBC: 6.1 10*3/uL (ref 3.4–10.8)

## 2015-07-15 LAB — LIPID PANEL
CHOL/HDL RATIO: 3 ratio (ref 0.0–4.4)
CHOLESTEROL TOTAL: 131 mg/dL (ref 100–199)
HDL: 44 mg/dL (ref >39)
LDL CALC: 64 mg/dL (ref 0–99)
Triglycerides: 114 mg/dL (ref 0–149)
VLDL CHOLESTEROL CAL: 23 mg/dL (ref 5–40)

## 2015-08-07 ENCOUNTER — Encounter: Payer: Self-pay | Admitting: Pharmacist

## 2015-08-07 ENCOUNTER — Ambulatory Visit (INDEPENDENT_AMBULATORY_CARE_PROVIDER_SITE_OTHER): Payer: BLUE CROSS/BLUE SHIELD | Admitting: Pharmacist

## 2015-08-07 VITALS — BP 112/78 | HR 78 | Ht 67.0 in | Wt 296.0 lb

## 2015-08-07 DIAGNOSIS — E119 Type 2 diabetes mellitus without complications: Secondary | ICD-10-CM | POA: Diagnosis not present

## 2015-08-07 MED ORDER — ASPIRIN EC 81 MG PO TBEC: 81.0000 mg | DELAYED_RELEASE_TABLET | Freq: Every day | ORAL | Status: DC

## 2015-08-07 NOTE — Progress Notes (Signed)
Subjective:    Debbie Hunt is a 49 y.o. female who presents for an initial evaluation of Type 2 diabetes mellitus and diabetes education.   Current symptoms/problems include none and have been stable.   Debbie Hunt was initially diagnosed with type to DM about 7 or 8 years ago.  She is currently taking metformin  1 tablet BID.   She has gestational diabetes with her son who is 13yo now but not with her daughter who is 22yo now.  However her daughter was over 9# at birth.   About 1 year ago her husband was also diagnosed with type 2 DM and they both have been trying to adjust their diets.   Known diabetic complications: none Cardiovascular risk factors: diabetes mellitus, family history of premature cardiovascular disease, hypertension, obesity (BMI >= 30 kg/m2) and sedentary lifestyle  Eye exam current (within one year): yes Weight trend: stable Prior visit with dietician:  Yes - but has been over 13 years when she had gestational diabetes Current diet: in general, an "unhealthy" diet Current exercise: some walking and use of recumbant bike  Current monitoring regimen: home blood tests - 1 to 2  times daily Any episodes of hypoglycemia? no  Is She on ACE inhibitor or angiotensin II receptor blocker?  Yes  lisinopril (generic) + HCTZ    The following portions of the patient's history were reviewed and updated as appropriate: allergies, current medications, past family history, past medical history, past social history, past surgical history and problem list.   Objective:    BP 112/78 mmHg  Pulse 78  Ht  (1.702 m)  Wt 296 lb (134.265 kg)  BMI 46.35 kg/m2   A1c = 7.3% 07/14/2015  Lab Review GLUCOSE (mg/dL)  Date Value  16/08/9603 151*  10/04/2014 270*  11/26/2013 171*   GLUCOSE, BLD (mg/dL)  Date Value  54/07/8118 118*   CO2 (mmol/L)  Date Value  07/14/2015 22  10/04/2014 24  11/26/2013 24   BUN (mg/dL)  Date Value  14/78/2956 12  10/04/2014 7   11/26/2013 11  05/17/2011 12   CREATININE, SER (mg/dL)  Date Value  21/30/8657 0.71  10/04/2014 0.62  10/04/2014 0.66    Assessment:    Diabetes Mellitus type II, under fair control.   HTN - controlled Obesity - stable Lipids - at goals    Plan:    1.  Rx changes: add ASA  1 tablet daily 2.  Education: Reviewed 'ABCs' of diabetes management (respective goals in parentheses):  A1C (<7), blood pressure (<130/80), and cholesterol (LDL <100). 3.  Compliance at present is estimated to be good.  4.  Reviewed CHO counting in depth with patient.  In particular address areas of patient's concern wich were snacks and breakfast.  Also discussed reducing caloric intake 5.  Increase physical activity - goal is 150 minutes weekly on at least 4 days per week. Discussed family type outings for weekend such as hikes and going to park that can help increase exercise. 6.   Orders Placed This Encounter  Procedures  . Microalbumin / creatinine urine ratio    7.    Follow up: 3 months  with PCP and as needed with CDE for education.  Henrene Pastor, PharmD, CPP, CDE

## 2015-08-07 NOTE — Patient Instructions (Signed)
Diabetes and Standards of Medical Care   Diabetes is complicated. You may find that your diabetes team includes a dietitian, nurse, diabetes educator, eye doctor, and more. To help everyone know what is going on and to help you get the care you deserve, the following schedule of care was developed to help keep you on track. Below are the tests, exams, vaccines, medicines, education, and plans you will need.  Blood Glucose Goals Prior to meals = 80 - 130 Within 2 hours of the start of a meal = less than 180  HbA1c test (goal is less than 7.0% - your last value was 7.3%) This test shows how well you have controlled your glucose over the past 2 to 3 months. It is used to see if your diabetes management plan needs to be adjusted.   It is performed at least 2 times a year if you are meeting treatment goals.  It is performed 4 times a year if therapy has changed or if you are not meeting treatment goals.  Blood pressure test  This test is performed at every routine medical visit. The goal is less than 140/90 mmHg for most people, but 130/80 mmHg in some cases. Ask your health care provider about your goal.  Dental exam  Follow up with the dentist regularly.  Eye exam  If you are diagnosed with type 1 diabetes as a child, get an exam upon reaching the age of 10 years or older and have had diabetes for 3 to 5 years. Yearly eye exams are recommended after that initial eye exam.  If you are diagnosed with type 1 diabetes as an adult, get an exam within 5 years of diagnosis and then yearly.  If you are diagnosed with type 2 diabetes, get an exam as soon as possible after the diagnosis and then yearly.  Foot care exam  Visual foot exams are performed at every routine medical visit. The exams check for cuts, injuries, or other problems with the feet.  A comprehensive foot exam should be done yearly. This includes visual inspection as well as assessing foot pulses and testing for loss of  sensation.  Check your feet nightly for cuts, injuries, or other problems with your feet. Tell your health care provider if anything is not healing.  Kidney function test (urine microalbumin)  This test is performed once a year.  Type 1 diabetes: The first test is performed 5 years after diagnosis.  Type 2 diabetes: The first test is performed at the time of diagnosis.  A serum creatinine and estimated glomerular filtration rate (eGFR) test is done once a year to assess the level of chronic kidney disease (CKD), if present.  Lipid profile (cholesterol, HDL, LDL, triglycerides)  Performed every 5 years for most people.  The goal for LDL is less than 100 mg/dL. If you are at high risk, the goal is less than 70 mg/dL.  The goal for HDL is 40 mg/dL to 50 mg/dL for men and 50 mg/dL to 60 mg/dL for women. An HDL cholesterol of 60 mg/dL or higher gives some protection against heart disease.  The goal for triglycerides is less than 150 mg/dL.  Influenza vaccine, pneumococcal vaccine, and hepatitis B vaccine  The influenza vaccine is recommended yearly.  The pneumococcal vaccine is generally given once in a lifetime. However, there are some instances when another vaccination is recommended. Check with your health care provider.  The hepatitis B vaccine is also recommended for adults with diabetes.    Diabetes self-management education  Education is recommended at diagnosis and ongoing as needed.  Treatment plan  Your treatment plan is reviewed at every medical visit.  Document Released: 09/04/2009 Document Revised: 07/10/2013 Document Reviewed: 04/09/2013 [employer] Patient Information 2014 [employer].   

## 2015-08-08 LAB — MICROALBUMIN / CREATININE URINE RATIO: CREATININE, UR: 35.9 mg/dL

## 2015-08-10 ENCOUNTER — Encounter: Payer: Self-pay | Admitting: Pharmacist

## 2015-08-20 ENCOUNTER — Ambulatory Visit: Payer: Self-pay

## 2015-08-25 ENCOUNTER — Ambulatory Visit (INDEPENDENT_AMBULATORY_CARE_PROVIDER_SITE_OTHER): Payer: BLUE CROSS/BLUE SHIELD

## 2015-08-25 DIAGNOSIS — Z23 Encounter for immunization: Secondary | ICD-10-CM | POA: Diagnosis not present

## 2015-10-19 ENCOUNTER — Ambulatory Visit (INDEPENDENT_AMBULATORY_CARE_PROVIDER_SITE_OTHER): Payer: BLUE CROSS/BLUE SHIELD

## 2015-10-19 ENCOUNTER — Encounter: Payer: Self-pay | Admitting: Family Medicine

## 2015-10-19 ENCOUNTER — Ambulatory Visit (INDEPENDENT_AMBULATORY_CARE_PROVIDER_SITE_OTHER): Payer: BLUE CROSS/BLUE SHIELD | Admitting: Family Medicine

## 2015-10-19 VITALS — BP 121/79 | HR 73 | Temp 97.1°F | Ht 67.0 in | Wt 294.4 lb

## 2015-10-19 DIAGNOSIS — E118 Type 2 diabetes mellitus with unspecified complications: Secondary | ICD-10-CM

## 2015-10-19 DIAGNOSIS — M79672 Pain in left foot: Secondary | ICD-10-CM | POA: Diagnosis not present

## 2015-10-19 DIAGNOSIS — I10 Essential (primary) hypertension: Secondary | ICD-10-CM | POA: Diagnosis not present

## 2015-10-19 LAB — POCT GLYCOSYLATED HEMOGLOBIN (HGB A1C): HEMOGLOBIN A1C: 7.2

## 2015-10-19 NOTE — Patient Instructions (Addendum)
Great to see you!  Your A1C is stable at 7.2, keep doing what you are doing! Continue to watch your carbohydrates and be careful to keep unusual eating to only the holidays, not the entire season.     Come back in 3 months  Diet Recommendations for Diabetes   Starchy (carb) foods include: Bread, rice, pasta, potatoes, corn, crackers, bagels, muffins, all baked goods.   Protein foods include: Meat, fish, poultry, eggs, dairy foods, and beans such as pinto and kidney beans (beans also provide carbohydrate).   1. Eat at least 3 meals and 1-2 snacks per day. Never go more than 4-5 hours while awake without eating.  2. Limit starchy foods to TWO per meal and ONE per snack. ONE portion of a starchy  food is equal to the following:   - ONE slice of bread (or its equivalent, such as half of a hamburger bun).   - 1/2 cup of a "scoopable" starchy food such as potatoes or rice.   - 1 OUNCE (28 grams) of starchy snack foods such as crackers or pretzels (look on label).   - 15 grams of carbohydrate as shown on food label.  3. Both lunch and dinner should include a protein food, a carb food, and vegetables.   - Obtain twice as many veg's as protein or carbohydrate foods for both lunch and dinner.   - Try to keep frozen veg's on hand for a quick vegetable serving.     - Fresh or frozen veg's are best.  4. Breakfast should always include protein.

## 2015-10-19 NOTE — Progress Notes (Signed)
   HPI  Patient presents today for follow-up hypertension diabetes, also foot pain.  The pain Last 3-4 months Described as dull achy left foot pain worse after walking all day. Described as around the base of the left fifth metatarsal. Denies any injury. No medications health.  Diabetes Average fasting blood sugar 150-175 Good medication compliance No neuropathy  Hypertension No chest pain, dyspnea, palpitations, leg edema Good medication compliance.  PMH: Smoking status noted ROS: Per HPI  Objective: BP 121/79 mmHg  Pulse 73  Temp(Src) 97.1 F (36.2 C) (Oral)  Ht 5\' 7"  (1.702 m)  Wt 294 lb 6.4 oz (133.539 kg)  BMI 46.10 kg/m2 Gen: NAD, alert, cooperative with exam HEENT: NCAT CV: RRR, good S1/S2, no murmur Resp: CTABL, no wheezes, non-labored Ext: No edema, warm Neuro: Alert and oriented, No gross deficits MSK: Left foot with no erythema or deformity, tenderness to palpation over the base of the left metatarsal on the lateral edge of the left foot, 2+ or status post pulse  DG L foot- No acute findings, no 5th metatarsal fracture, she does have a large heel spur.   Assessment and plan:   #Diabetes  controlled, A1c stable at 7.2 Continue metformin at current dose Labs in 9 months Follow-up 3 months.  #Hypertension Well controlled Continue Prinzide  # left hip pain Plain film normal today (except for spur, but unlikely the pain generator) Unclear etiology, initially concerned about fifth metatarsal fracture Recommend follow-up with her sports medicine doctor.     Orders Placed This Encounter  Procedures  . DG Foot Complete Left    Standing Status: Future     Number of Occurrences: 1     Standing Expiration Date: 12/18/2016    Order Specific Question:  Reason for Exam (SYMPTOM  OR DIAGNOSIS REQUIRED)    Answer:  pain, r/o fracture    Order Specific Question:  Is the patient pregnant?    Answer:  No    Order Specific Question:  Preferred imaging  location?    Answer:  Internal  . POCT glycosylated hemoglobin (Hb A1C)     Murtis SinkSam Telsa Dillavou, MD [facility] 10/19/2015, 8:12 AM

## 2016-01-19 ENCOUNTER — Encounter: Payer: Self-pay | Admitting: Family Medicine

## 2016-01-19 ENCOUNTER — Ambulatory Visit (INDEPENDENT_AMBULATORY_CARE_PROVIDER_SITE_OTHER): Payer: BLUE CROSS/BLUE SHIELD | Admitting: Family Medicine

## 2016-01-19 VITALS — BP 119/71 | HR 82 | Temp 97.2°F | Ht 67.0 in | Wt 292.0 lb

## 2016-01-19 DIAGNOSIS — E119 Type 2 diabetes mellitus without complications: Secondary | ICD-10-CM

## 2016-01-19 DIAGNOSIS — I10 Essential (primary) hypertension: Secondary | ICD-10-CM

## 2016-01-19 LAB — POCT GLYCOSYLATED HEMOGLOBIN (HGB A1C): Hemoglobin A1C: 7.2

## 2016-01-19 NOTE — Progress Notes (Signed)
   HPI  Patient presents today here for follow-up diabetes, hypertension, obesity, cough and cold.  Cough and cold 2 days of nasal congestion and frequent throat clearing No chest pain, shortness of breath, malaise, difficulty tolerating fluids or fluids.  Hypertension Watching her diet intermittently, starting to exercise as described below. Noncompliance. No chest pain, dyspnea, palpitations, leg edema.  Diabetes Fasting blood sugars average 140-160 Good medication compliance Watching diet intermittently No neuropathy Has diabetic eye exam scheduled in March.  Obesity Aware, start an exercise and watching diet  PMH: Smoking status noted ROS: Per HPI  Objective: BP 119/71 mmHg  Pulse 82  Temp(Src) 97.2 F (36.2 C) (Oral)  Ht  (1.702 m)  Wt 292 lb (132.45 kg)  BMI 45.72 kg/m2 Gen: NAD, alert, cooperative with exam HEENT: NCAT, nares with some swelling, right greater than left, TMs normal bilaterally, oropharynx clear CV: RRR, good S1/S2, no murmur Resp: CTABL, no wheezes, non-labored Abd: SNTND, BS present, no guarding or organomegaly Ext: No edema, warm Neuro: Alert and oriented, No gross deficits  Diabetic foot exam 2+ dorsalis pedis pulses, no lesions, sensation intact to monofilament throughout  Assessment and plan:  # DM2 Stable, A1C unchanged No change in meds but with obesity and A1C of 7.2 consider Weekly GLP- consider and make appt with clinical pharmacist that she is interested. Diabetic foot exam normal. Has ophthalmology appointment  # HTN Well-controlled No changes in medication  # Obesity Discussed Consider weekly GLP Diet and exercise improvement, congratulated  # Healthcare maintenance Foot exam today, mammogram up-to-date, ophthalmology also scheduled  Labs in 6 months  Murtis Sink, MD [facility] 01/19/2016, 8:13 AM

## 2016-01-19 NOTE — Patient Instructions (Signed)
Great to see you!  Consider Trulicity, make an appointment to discuss with our pharmacist if you would like too   Lets see you back in 3 months   You are doing great, keep it up!

## 2016-01-21 ENCOUNTER — Telehealth: Payer: Self-pay | Admitting: Family Medicine

## 2016-01-21 MED ORDER — BENZONATATE 200 MG PO CAPS: 200.0000 mg | ORAL_CAPSULE | Freq: Two times a day (BID) | ORAL | Status: DC | PRN

## 2016-01-21 NOTE — Telephone Encounter (Signed)
Pt aware rx sent in. Pt states that she is having no body aches, chills or fever. Pt states that she feels fine except for a dry cough that wont go away, no other symptoms. Advised pt to try the tessalon peals and if it did not improve or if she started developing chills, body aches or fever that she needed to come in to be seen. Pt understands and states she will watch it and let us know if she gets worse.

## 2016-01-21 NOTE — Telephone Encounter (Signed)
Tc to pt, she has developed a cough since being seen on Monday, has tried Robitussin gels, not able to due liquids d/t gag reflex, running low grade 99 fever. can cough med be called into [facility].

## 2016-01-21 NOTE — Telephone Encounter (Signed)
Sent [employer].  I think that she might need to be seen to consider the flu.  Murtis Sink, MD [facility] 01/21/2016, 9:51 AM

## 2016-01-22 ENCOUNTER — Encounter: Payer: Self-pay | Admitting: Pediatrics

## 2016-01-22 ENCOUNTER — Ambulatory Visit (INDEPENDENT_AMBULATORY_CARE_PROVIDER_SITE_OTHER): Payer: BLUE CROSS/BLUE SHIELD | Admitting: Pediatrics

## 2016-01-22 VITALS — BP 135/90 | HR 80 | Temp 96.5°F | Ht 67.0 in | Wt 292.6 lb

## 2016-01-22 DIAGNOSIS — R6889 Other general symptoms and signs: Secondary | ICD-10-CM

## 2016-01-22 DIAGNOSIS — J101 Influenza due to other identified influenza virus with other respiratory manifestations: Secondary | ICD-10-CM | POA: Diagnosis not present

## 2016-01-22 DIAGNOSIS — R05 Cough: Secondary | ICD-10-CM | POA: Diagnosis not present

## 2016-01-22 LAB — POCT INFLUENZA A/B
INFLUENZA A, POC: POSITIVE — AB
INFLUENZA B, POC: NEGATIVE

## 2016-01-22 MED ORDER — OSELTAMIVIR PHOSPHATE 75 MG PO CAPS: 75.0000 mg | ORAL_CAPSULE | Freq: Two times a day (BID) | ORAL | Status: DC

## 2016-01-22 NOTE — Progress Notes (Signed)
    Subjective:    Patient ID: Debbie Hunt, female    DOB: 08/15/1966, 50 y.o.   MRN: [id number]  CC: Cough; Nasal Congestion; and Chills   HPI: Debbie Hunt is a 50 y.o. female presenting for Cough; Nasal Congestion; and Chills  Two days ago started having nasal congestion Last night worse Muscle aches, fever, chills, subjective Appetite is ok No N/V/D No known sick contacts Did get her flu shot   [facility] 01/22/2016 01/19/2016 10/19/2015 07/14/2015 10/04/2014  Decreased Interest 0 0 0 0 0  Down, Depressed, Hopeless 0 0 0 0 0  PHQ - 2 Score 0 0 0 0 0     Relevant past medical, surgical, family and social history reviewed and updated as indicated. Interim medical history since our last visit reviewed. Allergies and medications reviewed and updated.    ROS: Per HPI unless specifically indicated above  History  Smoking status  . Never Smoker   Smokeless tobacco  . Never Used    Past Medical History Patient Active Problem List   Diagnosis Date Noted  . Left foot pain 10/19/2015  . Thumb pain 07/14/2015  . DM (diabetes mellitus) (HCC) 03/21/2011  . HTN (hypertension) 03/21/2011  . Obesity, Class III, BMI 40-49.9 (morbid obesity) (HCC) 03/21/2011  . Dyslipidemia 03/21/2011       Objective:    BP 135/90 mmHg  Pulse 80  Temp(Src) 96.5 F (35.8 C) (Oral)  Ht 5\' 7"  (1.702 m)  Wt 292 lb 9.6 oz (132.722 kg)  BMI 45.82 kg/m2  Wt Readings from Last 3 Encounters:  01/22/16 292 lb 9.6 oz (132.722 kg)  01/19/16 292 lb (132.45 kg)  10/19/15 294 lb 6.4 oz (133.539 kg)     Gen: NAD, alert, cooperative with exam, NCAT, congested EYES: EOMI, no scleral injection or icterus ENT:  TMs pearly gray b/l, OP without erythema LYMPH: no cervical LAD CV: NRRR, normal S1/S2, no murmur, distal pulses 2+ b/l Resp: CTABL, no wheezes, normal WOB Abd: +BS, soft, NTND.  Neuro: Alert and oriented MSK: normal muscle bulk     Assessment & Plan:    Debbie Hunt  was seen today for cough, nasal congestion and chills, has flu A. Discussed symptomatic care. Gave ppx tamiflu to family members.  Diagnoses and all orders for this visit:  Flu-like symptoms -     POCT Influenza A/B  Influenza A -     oseltamivir (TAMIFLU) 75 MG capsule; Take 1 capsule (75 mg total) by mouth 2 (two) times daily.   Follow up plan: Return if symptoms worsen or fail to improve.  Rex Krasarol Vincent, MD [facility] 01/22/2016, 8:39 AM

## 2016-02-08 ENCOUNTER — Encounter: Payer: Self-pay | Admitting: *Deleted

## 2016-02-20 ENCOUNTER — Ambulatory Visit (INDEPENDENT_AMBULATORY_CARE_PROVIDER_SITE_OTHER): Payer: BLUE CROSS/BLUE SHIELD | Admitting: Family

## 2016-02-20 VITALS — BP 143/88 | HR 76 | Temp 98.0°F | Ht 67.0 in | Wt 295.6 lb

## 2016-02-20 DIAGNOSIS — J029 Acute pharyngitis, unspecified: Secondary | ICD-10-CM

## 2016-02-20 DIAGNOSIS — J069 Acute upper respiratory infection, unspecified: Secondary | ICD-10-CM | POA: Diagnosis not present

## 2016-02-20 LAB — CULTURE, GROUP A STREP

## 2016-02-20 LAB — RAPID STREP SCREEN (MED CTR MEBANE ONLY): STREP GP A AG, IA W/REFLEX: NEGATIVE

## 2016-02-20 NOTE — Patient Instructions (Signed)
Upper Respiratory Infection, Adult Most upper respiratory infections (URIs) are a viral infection of the air passages leading to the lungs. A URI affects the nose, throat, and upper air passages. The most common type of URI is nasopharyngitis and is typically referred to as "the common cold." URIs run their course and usually go away on their own. Most of the time, a URI does not require medical attention, but sometimes a bacterial infection in the upper airways can follow a viral infection. This is called a secondary infection. Sinus and middle ear infections are common types of secondary upper respiratory infections. Bacterial pneumonia can also complicate a URI. A URI can worsen asthma and chronic obstructive pulmonary disease (COPD). Sometimes, these complications can require emergency medical care and may be life threatening.  CAUSES Almost all URIs are caused by viruses. A virus is a type of germ and can spread from one person to another.  RISKS FACTORS You may be at risk for a URI if:   You smoke.   You have chronic heart or lung disease.  You have a weakened defense (immune) system.   You are very young or very old.   You have nasal allergies or asthma.  You work in crowded or poorly ventilated areas.  You work in health care facilities or schools. SIGNS AND SYMPTOMS  Symptoms typically develop 2-3 days after you come in contact with a cold virus. Most viral URIs last 7-10 days. However, viral URIs from the influenza virus (flu virus) can last 14-18 days and are typically more severe. Symptoms may include:   Runny or stuffy (congested) nose.   Sneezing.   Cough.   Sore throat.   Headache.   Fatigue.   Fever.   Loss of appetite.   Pain in your forehead, behind your eyes, and over your cheekbones (sinus pain).  Muscle aches.  DIAGNOSIS  Your health care provider may diagnose a URI by:  Physical exam.  Tests to check that your symptoms are not due to  another condition such as:  Strep throat.  Sinusitis.  Pneumonia.  Asthma. TREATMENT  A URI goes away on its own with time. It cannot be cured with medicines, but medicines may be prescribed or recommended to relieve symptoms. Medicines may help:  Reduce your fever.  Reduce your cough.  Relieve nasal congestion. HOME CARE INSTRUCTIONS   Take medicines only as directed by your health care provider.   Gargle warm saltwater or take cough drops to comfort your throat as directed by your health care provider.  Use a warm mist humidifier or inhale steam from a shower to increase air moisture. This may make it easier to breathe.  Drink enough fluid to keep your urine clear or pale yellow.   Eat soups and other clear broths and maintain good nutrition.   Rest as needed.   Return to work when your temperature has returned to normal or as your health care provider advises. You may need to stay home longer to avoid infecting others. You can also use a face mask and careful hand washing to prevent spread of the virus.  Increase the usage of your inhaler if you have asthma.   Do not use any tobacco products, including cigarettes, chewing tobacco, or electronic cigarettes. If you need help quitting, ask your health care provider. PREVENTION  The best way to protect yourself from getting a cold is to practice good hygiene.   Avoid oral or hand contact with people with cold   symptoms.   Wash your hands often if contact occurs.  There is no clear evidence that vitamin C, vitamin E, echinacea, or exercise reduces the chance of developing a cold. However, it is always recommended to get plenty of rest, exercise, and practice good nutrition.  SEEK MEDICAL CARE IF:   You are getting worse rather than better.   Your symptoms are not controlled by medicine.   You have chills.  You have worsening shortness of breath.  You have brown or red mucus.  You have yellow or brown nasal  discharge.  You have pain in your face, especially when you bend forward.  You have a fever.  You have swollen neck glands.  You have pain while swallowing.  You have white areas in the back of your throat. SEEK IMMEDIATE MEDICAL CARE IF:   You have severe or persistent:  Headache.  Ear pain.  Sinus pain.  Chest pain.  You have chronic lung disease and any of the following:  Wheezing.  Prolonged cough.  Coughing up blood.  A change in your usual mucus.  You have a stiff neck.  You have changes in your:  Vision.  Hearing.  Thinking.  Mood. MAKE SURE YOU:   Understand these instructions.  Will watch your condition.  Will get help right away if you are not doing well or get worse.   This information is not intended to replace advice given to you by your health care provider. Make sure you discuss any questions you have with your health care provider.   Document Released: 05/03/2001 Document Revised: 03/24/2015 Document Reviewed: 02/12/2014 Elsevier Interactive Patient Education 2016 [employer]  - Take meds as prescribed - Use a cool mist humidifier  -Use saline nose sprays frequently -Saline irrigations of the nose can be very helpful if done frequently.  * 4X daily for 1 week*  * Use of a nettie pot can be helpful with this. Follow directions with this* -Force fluids -For any cough or congestion  Use plain Mucinex- regular strength or max strength is fine   * Children- consult with Pharmacist for dosing -For fever or aces or pains- take tylenol or ibuprofen appropriate for age and weight.  * for fevers greater than 101 orally you may alternate ibuprofen and tylenol every  3 hours. -Throat lozenges if help -New toothbrush in 3 days   Savien Mamula, FNP  

## 2016-02-20 NOTE — Progress Notes (Signed)
   Subjective:    Patient ID: Debbie Hunt, female    DOB: 02/21/1966, 50 y.o.   MRN: [id number]  Sore Throat  This is a new problem. The current episode started yesterday. The problem has been unchanged. There has been no fever. The pain is at a severity of 5/10. The pain is mild. Associated symptoms include congestion, headaches, a hoarse voice and trouble swallowing. Pertinent negatives include no coughing, ear discharge, ear pain, neck pain, shortness of breath or swollen glands. She has had no exposure to strep. She has tried acetaminophen for the symptoms. The treatment provided mild relief.      Review of Systems  Constitutional: Negative.   HENT: Positive for congestion, hoarse voice and trouble swallowing. Negative for ear discharge and ear pain.   Eyes: Negative.   Respiratory: Negative.  Negative for cough and shortness of breath.   Cardiovascular: Negative.  Negative for palpitations.  Gastrointestinal: Negative.   Endocrine: Negative.   Genitourinary: Negative.   Musculoskeletal: Negative.  Negative for neck pain.  Neurological: Positive for headaches.  Hematological: Negative.   Psychiatric/Behavioral: Negative.   All other systems reviewed and are negative.      Objective:   Physical Exam  Constitutional: She is oriented to person, place, and time. She appears well-developed and well-nourished. No distress.  HENT:  Head: Normocephalic and atraumatic.  Right Ear: External ear normal.  Left Ear: External ear normal.  Nasal passage erythemas with mild swelling  Oropharynx erythemas  Eyes: Pupils are equal, round, and reactive to light.  Neck: Normal range of motion. Neck supple. No thyromegaly present.  Cardiovascular: Normal rate, regular rhythm, normal heart sounds and intact distal pulses.   No murmur heard. Pulmonary/Chest: Effort normal and breath sounds normal. No respiratory distress. She has no wheezes.  Abdominal: Soft. Bowel sounds are normal. She  exhibits no distension. There is no tenderness.  Musculoskeletal: Normal range of motion. She exhibits no edema or tenderness.  Neurological: She is alert and oriented to person, place, and time. She has normal reflexes. No cranial nerve deficit.  Skin: Skin is warm and dry.  Psychiatric: She has a normal mood and affect. Her behavior is normal. Judgment and thought content normal.  Vitals reviewed.     BP 143/88 mmHg  Pulse 76  Temp(Src) 98 F (36.7 C) (Oral)  Ht 5\' 7"  (1.702 m)  Wt 295 lb 9.6 oz (134.083 kg)  BMI 46.29 kg/m2     Assessment & Plan:  1. Sore throat - Rapid strep screen (not at [facility])  2. Acute upper respiratory infection -- Take meds as prescribed - Use a cool mist humidifier  -Use saline nose sprays frequently -Saline irrigations of the nose can be very helpful if done frequently.  * 4X daily for 1 week*  * Use of a nettie pot can be helpful with this. Follow directions with this* -Force fluids -For any cough or congestion  Use plain Mucinex- regular strength or max strength is fine   * Children- consult with Pharmacist for dosing -For fever or aces or pains- take tylenol or ibuprofen appropriate for age and weight.  * for fevers greater than 101 orally you may alternate ibuprofen and tylenol every  3 hours. -Throat lozenges if help -New toothbrush in 3 days  Jannifer Rodneyhristy Hawks, FNP

## 2016-02-22 ENCOUNTER — Telehealth: Payer: Self-pay | Admitting: Family Medicine

## 2016-02-22 MED ORDER — AZITHROMYCIN 250 MG PO TABS: ORAL_TABLET | ORAL | Status: DC

## 2016-02-22 NOTE — Telephone Encounter (Signed)
Zpak Prescription sent to pharmacy   

## 2016-02-22 NOTE — Telephone Encounter (Signed)
Patient aware.

## 2016-02-22 NOTE — Telephone Encounter (Signed)
Lmtcb with symptoms patient is still having.

## 2016-02-22 NOTE — Telephone Encounter (Signed)
Patient that she is having a lot of green mucous and cough. She states that she is just feeling worse and would like an antibiotic. Patient is still taking the mucinex.

## 2016-05-18 DIAGNOSIS — Z124 Encounter for screening for malignant neoplasm of cervix: Secondary | ICD-10-CM | POA: Diagnosis not present

## 2016-05-18 DIAGNOSIS — D1801 Hemangioma of skin and subcutaneous tissue: Secondary | ICD-10-CM | POA: Diagnosis not present

## 2016-05-18 DIAGNOSIS — Z01419 Encounter for gynecological examination (general) (routine) without abnormal findings: Secondary | ICD-10-CM | POA: Diagnosis not present

## 2016-05-18 DIAGNOSIS — Z1231 Encounter for screening mammogram for malignant neoplasm of breast: Secondary | ICD-10-CM | POA: Diagnosis not present

## 2016-05-18 DIAGNOSIS — D225 Melanocytic nevi of trunk: Secondary | ICD-10-CM | POA: Diagnosis not present

## 2016-05-18 DIAGNOSIS — Z6841 Body Mass Index (BMI) 40.0 and over, adult: Secondary | ICD-10-CM | POA: Diagnosis not present

## 2016-05-18 DIAGNOSIS — L814 Other melanin hyperpigmentation: Secondary | ICD-10-CM | POA: Diagnosis not present

## 2016-07-09 ENCOUNTER — Other Ambulatory Visit: Payer: Self-pay | Admitting: Family Medicine

## 2016-08-23 ENCOUNTER — Other Ambulatory Visit: Payer: Self-pay | Admitting: Family Medicine

## 2016-10-09 ENCOUNTER — Other Ambulatory Visit: Payer: Self-pay | Admitting: Family Medicine

## 2016-11-29 LAB — HM DIABETES EYE EXAM

## 2017-01-07 ENCOUNTER — Other Ambulatory Visit: Payer: Self-pay | Admitting: Family Medicine

## 2017-01-09 NOTE — Telephone Encounter (Signed)
Patient aware.

## 2017-03-13 ENCOUNTER — Encounter: Payer: Self-pay | Admitting: Family Medicine

## 2017-03-13 ENCOUNTER — Ambulatory Visit (INDEPENDENT_AMBULATORY_CARE_PROVIDER_SITE_OTHER): Payer: BLUE CROSS/BLUE SHIELD | Admitting: Family Medicine

## 2017-03-13 VITALS — BP 133/81 | HR 78 | Temp 97.2°F | Ht 67.0 in | Wt 294.8 lb

## 2017-03-13 DIAGNOSIS — E785 Hyperlipidemia, unspecified: Secondary | ICD-10-CM | POA: Diagnosis not present

## 2017-03-13 DIAGNOSIS — I10 Essential (primary) hypertension: Secondary | ICD-10-CM

## 2017-03-13 DIAGNOSIS — E119 Type 2 diabetes mellitus without complications: Secondary | ICD-10-CM

## 2017-03-13 LAB — BAYER DCA HB A1C WAIVED: HB A1C (BAYER DCA - WAIVED): 8.9 % — ABNORMAL HIGH (ref <7.0)

## 2017-03-13 NOTE — Progress Notes (Signed)
   HPI  Patient presents today here to follow-up for diabetes and other chronic medical conditions.  Diabetes Average fasting 150-175 Good metformin compliance Fasting today. Considering G LP.  Patient has been watching her diet moderately.  Hyperlipidemia Watching diet moderately as above, no medications.  Hypertension. Good medication compliance with Prinzide, no chest pain, dyspnea, palpitations.  PMH: Smoking status noted ROS: Per HPI  Objective: BP 133/81   Pulse 78   Temp 97.2 F (36.2 C) (Oral)   Ht '5\' 7"'$  (1.702 m)   Wt 294 lb 12.8 oz (133.7 kg)   BMI 46.17 kg/m  Gen: NAD, alert, cooperative with exam HEENT: NCAT, EOMI, PERRL CV: RRR, good S1/S2, no murmur Resp: CTABL, no wheezes, non-labored Ext: No edema, warm Neuro: Alert and oriented, No gross deficits  Assessment and plan:  # Dm2' \\uncontrolled'$ , with obesity recommending starting GLP F/u with clinical pharmacist Eye exam done in Jan F/u 3 months  # HLD LAbs today, no meds, moderate diet effort  # HTN Controlled on prinzide, labs, no changes   Orders Placed This Encounter  Procedures  . Microalbumin / creatinine urine ratio  . Bayer DCA Hb A1c Waived  . CMP14+EGFR  . Lipid panel  . CBC with Differential/Platelet  . TSH     Laroy Apple, MD [facility] 03/13/2017, 9:39 AM

## 2017-03-13 NOTE — Patient Instructions (Signed)
Great to see you!  Com ebcak to see me in 3 months, come back to See Tammy in 2-4 weeks.   Consider an additional medication, ummariized below  Formalin +1 on the following.  Glipizide GLP-1 agonist - trulicity, victoza, ozempic ( injection, weight loss)- this would be my recommendation Dpp-4 : Januvia SGLT2 inhibitor- Invokana, farxiga, jardiance ( causes you to excrete sugar in urine)

## 2017-03-14 LAB — LIPID PANEL
Chol/HDL Ratio: 3.3 ratio (ref 0.0–4.4)
Cholesterol, Total: 140 mg/dL (ref 100–199)
HDL: 43 mg/dL
LDL Calculated: 73 mg/dL (ref 0–99)
Triglycerides: 122 mg/dL (ref 0–149)
VLDL Cholesterol Cal: 24 mg/dL (ref 5–40)

## 2017-03-14 LAB — CBC WITH DIFFERENTIAL/PLATELET
Basophils Absolute: 0 x10E3/uL (ref 0.0–0.2)
Basos: 0 %
EOS (ABSOLUTE): 0.1 x10E3/uL (ref 0.0–0.4)
Eos: 2 %
Hematocrit: 40.5 % (ref 34.0–46.6)
Hemoglobin: 13.2 g/dL (ref 11.1–15.9)
Immature Grans (Abs): 0 x10E3/uL (ref 0.0–0.1)
Immature Granulocytes: 0 %
Lymphocytes Absolute: 1.8 x10E3/uL (ref 0.7–3.1)
Lymphs: 30 %
MCH: 28.6 pg (ref 26.6–33.0)
MCHC: 32.6 g/dL (ref 31.5–35.7)
MCV: 88 fL (ref 79–97)
Monocytes Absolute: 0.3 x10E3/uL (ref 0.1–0.9)
Monocytes: 5 %
Neutrophils Absolute: 3.8 x10E3/uL (ref 1.4–7.0)
Neutrophils: 63 %
Platelets: 241 x10E3/uL (ref 150–379)
RBC: 4.62 x10E6/uL (ref 3.77–5.28)
RDW: 13.8 % (ref 12.3–15.4)
WBC: 6 x10E3/uL (ref 3.4–10.8)

## 2017-03-14 LAB — CMP14+EGFR
A/G RATIO: 1.5 (ref 1.2–2.2)
ALT: 20 IU/L (ref 0–32)
AST: 19 IU/L (ref 0–40)
Albumin: 4.1 g/dL (ref 3.5–5.5)
Alkaline Phosphatase: 81 IU/L (ref 39–117)
BUN / CREAT RATIO: 18 (ref 9–23)
BUN: 12 mg/dL (ref 6–24)
Bilirubin Total: 0.5 mg/dL (ref 0.0–1.2)
CALCIUM: 9.5 mg/dL (ref 8.7–10.2)
CO2: 26 mmol/L (ref 18–29)
Chloride: 96 mmol/L (ref 96–106)
Creatinine, Ser: 0.67 mg/dL (ref 0.57–1.00)
GFR calc Af Amer: 119 mL/min/{1.73_m2} (ref >59)
GFR, EST NON AFRICAN AMERICAN: 104 mL/min/{1.73_m2} (ref >59)
GLOBULIN, TOTAL: 2.7 g/dL (ref 1.5–4.5)
GLUCOSE: 242 mg/dL — AB (ref 65–99)
POTASSIUM: 4.8 mmol/L (ref 3.5–5.2)
SODIUM: 138 mmol/L (ref 134–144)
Total Protein: 6.8 g/dL (ref 6.0–8.5)

## 2017-03-14 LAB — MICROALBUMIN / CREATININE URINE RATIO
Creatinine, Urine: 12.9 mg/dL
Microalb/Creat Ratio: <23.3 mg/g{creat} (ref 0.0–30.0)
Microalbumin, Urine: <3 ug/mL

## 2017-03-14 LAB — TSH: TSH: 2.92 u[IU]/mL (ref 0.450–4.500)

## 2017-03-28 ENCOUNTER — Other Ambulatory Visit: Payer: Self-pay | Admitting: Family Medicine

## 2017-03-28 ENCOUNTER — Telehealth: Payer: Self-pay | Admitting: Family Medicine

## 2017-03-28 MED ORDER — METFORMIN HCL 1000 MG PO TABS: 1000.0000 mg | ORAL_TABLET | Freq: Two times a day (BID) | ORAL | 0 refills | Status: DC

## 2017-03-28 NOTE — Telephone Encounter (Signed)
What is the name of the medication? Metformin  Have you contacted your pharmacy to request a refill? YES  Which pharmacy would you like this sent to? [facility]   Patient notified that their request is being sent to the clinical staff for review and that they should receive a call once it is complete. If they do not receive a call within 24 hours they can check with their pharmacy or our office.

## 2017-03-28 NOTE — Telephone Encounter (Signed)
done

## 2017-03-31 ENCOUNTER — Ambulatory Visit (INDEPENDENT_AMBULATORY_CARE_PROVIDER_SITE_OTHER): Payer: BLUE CROSS/BLUE SHIELD | Admitting: Family Medicine

## 2017-03-31 ENCOUNTER — Encounter: Payer: Self-pay | Admitting: Family Medicine

## 2017-03-31 VITALS — BP 131/82 | HR 88 | Temp 97.0°F | Ht 67.0 in | Wt 296.0 lb

## 2017-03-31 DIAGNOSIS — J029 Acute pharyngitis, unspecified: Secondary | ICD-10-CM

## 2017-03-31 LAB — CULTURE, GROUP A STREP

## 2017-03-31 LAB — RAPID STREP SCREEN (MED CTR MEBANE ONLY): STREP GP A AG, IA W/REFLEX: NEGATIVE

## 2017-03-31 MED ORDER — AZITHROMYCIN 250 MG PO TABS: ORAL_TABLET | ORAL | 0 refills | Status: DC

## 2017-03-31 NOTE — Progress Notes (Signed)
BP 131/82   Pulse 88   Temp 97 F (36.1 C) (Oral)   Ht 5\' 7"  (1.702 m)   Wt 296 lb (134.3 kg)   BMI 46.36 kg/m    Subjective:    Patient ID: Debbie Hunt, female    DOB: 12/05/1965, 51 y.o.   MRN: [id number]  HPI: Debbie FredericDana S Asper is a 51 y.o. female presenting on 03/31/2017 for Sore Throat (left side of throat; leaving Sunday to go out of town for work and wants to make sure it is not strep)   HPI  Sore throat and congestion Patient has been having sore throat and congestion that started last night. She used some Advil sinus and cold which helped some but she is more concerned just because she has a work trip where she is going to be out of town and did not want to get worse when she was out of town. She wanted us to test her for strep as well today. She denies any fevers or chills or shortness of breath or wheezing.  Relevant past medical, surgical, family and social history reviewed and updated as indicated. Interim medical history since our last visit reviewed. Allergies and medications reviewed and updated.  Review of Systems  Constitutional: Negative for chills and fever.  HENT: Positive for congestion, postnasal drip, rhinorrhea and sore throat. Negative for ear discharge, ear pain, sinus pressure and sneezing.   Eyes: Negative for pain, redness and visual disturbance.  Respiratory: Negative for cough, chest tightness and shortness of breath.   Cardiovascular: Negative for chest pain and leg swelling.  Genitourinary: Negative for difficulty urinating and dysuria.  Musculoskeletal: Negative for back pain and gait problem.  Skin: Negative for rash.  Neurological: Negative for light-headedness and headaches.  Psychiatric/Behavioral: Negative for agitation and behavioral problems.  All other systems reviewed and are negative.   Per HPI unless specifically indicated above        Objective:    BP 131/82   Pulse 88   Temp 97 F (36.1 C) (Oral)   Ht 5\' 7"  (1.702  m)   Wt 296 lb (134.3 kg)   BMI 46.36 kg/m   Wt Readings from Last 3 Encounters:  03/31/17 296 lb (134.3 kg)  03/13/17 294 lb 12.8 oz (133.7 kg)  02/20/16 295 lb 9.6 oz (134.1 kg)    Physical Exam  Constitutional: She is oriented to person, place, and time. She appears well-developed and well-nourished. No distress.  HENT:  Right Ear: Tympanic membrane, external ear and ear canal normal.  Left Ear: Tympanic membrane, external ear and ear canal normal.  Nose: Mucosal edema present. No rhinorrhea. No epistaxis. Right sinus exhibits no maxillary sinus tenderness and no frontal sinus tenderness. Left sinus exhibits no maxillary sinus tenderness and no frontal sinus tenderness.  Mouth/Throat: Uvula is midline and mucous membranes are normal. Posterior oropharyngeal edema present. No oropharyngeal exudate, posterior oropharyngeal erythema or tonsillar abscesses.  Eyes: Conjunctivae and EOM are normal.  Cardiovascular: Normal rate, regular rhythm, normal heart sounds and intact distal pulses.   No murmur heard. Pulmonary/Chest: Effort normal and breath sounds normal. No respiratory distress. She has no wheezes.  Musculoskeletal: Normal range of motion. She exhibits no edema or tenderness.  Neurological: She is alert and oriented to person, place, and time. Coordination normal.  Skin: Skin is warm and dry. No rash noted. She is not diaphoretic.  Psychiatric: She has a normal mood and affect. Her behavior is normal.  Vitals reviewed.  Rapid strep: Negative    Assessment & Plan:   Problem List Items Addressed This Visit    None    Visit Diagnoses    Acute pharyngitis, unspecified etiology    -  Primary   Patient instructed to use Flonase and an antihistamine, if worsens or does not improve pickup azithromycin   Relevant Orders   Rapid strep screen (not at [facility])       Follow up plan: Return if symptoms worsen or fail to improve.  Counseling provided for all of the vaccine  components Orders Placed This Encounter  Procedures  . Rapid strep screen (not at [facility])    Arville Care, MD [facility] 03/31/2017, 11:02 AM

## 2017-04-07 ENCOUNTER — Encounter: Payer: Self-pay | Admitting: *Deleted

## 2017-05-23 ENCOUNTER — Other Ambulatory Visit: Payer: Self-pay | Admitting: Family Medicine

## 2017-06-06 DIAGNOSIS — Z01419 Encounter for gynecological examination (general) (routine) without abnormal findings: Secondary | ICD-10-CM | POA: Diagnosis not present

## 2017-06-06 DIAGNOSIS — Z6841 Body Mass Index (BMI) 40.0 and over, adult: Secondary | ICD-10-CM | POA: Diagnosis not present

## 2017-06-06 DIAGNOSIS — Z1231 Encounter for screening mammogram for malignant neoplasm of breast: Secondary | ICD-10-CM | POA: Diagnosis not present

## 2017-06-06 DIAGNOSIS — Z124 Encounter for screening for malignant neoplasm of cervix: Secondary | ICD-10-CM | POA: Diagnosis not present

## 2017-06-15 ENCOUNTER — Ambulatory Visit: Payer: BLUE CROSS/BLUE SHIELD | Admitting: Family Medicine

## 2017-06-16 ENCOUNTER — Ambulatory Visit: Payer: BLUE CROSS/BLUE SHIELD | Admitting: Family Medicine

## 2017-07-11 ENCOUNTER — Encounter: Payer: Self-pay | Admitting: Family Medicine

## 2017-07-11 ENCOUNTER — Telehealth: Payer: Self-pay | Admitting: *Deleted

## 2017-07-11 ENCOUNTER — Ambulatory Visit (INDEPENDENT_AMBULATORY_CARE_PROVIDER_SITE_OTHER): Payer: BLUE CROSS/BLUE SHIELD | Admitting: Family Medicine

## 2017-07-11 VITALS — BP 133/89 | HR 68 | Temp 98.2°F | Ht 67.0 in | Wt 289.6 lb

## 2017-07-11 DIAGNOSIS — E119 Type 2 diabetes mellitus without complications: Secondary | ICD-10-CM

## 2017-07-11 LAB — BAYER DCA HB A1C WAIVED: HB A1C: 8.2 % — AB (ref <7.0)

## 2017-07-11 MED ORDER — SITAGLIPTIN PHOS-METFORMIN HCL 50-1000 MG PO TABS: 1.0000 | ORAL_TABLET | Freq: Two times a day (BID) | ORAL | 5 refills | Status: DC

## 2017-07-11 NOTE — Progress Notes (Addendum)
   HPI  Patient presents today follow-up for diabetes.  Patient states that she's been watching her diet much closer than she was previously. She is also exercising regularly. She is happy about her recent weight loss. She wanted to avoid additional medications and see what she can do with diet and lifestyle, she did not start Victoza or similar medications.  She has good medication compliance. No side effects.   PMH: Smoking status noted ROS: Per HPI  Objective: BP 133/89   Pulse 68   Temp 98.2 F (36.8 C) (Oral)   Ht 5\' 7"  (1.702 m)   Wt 289 lb 9.6 oz (131.4 kg)   BMI 45.36 kg/m  Gen: NAD, alert, cooperative with exam HEENT: NCAT CV: RRR, good S1/S2, no murmur Resp: CTABL, no wheezes, non-labored Ext: No edema, warm Neuro: Alert and oriented, No gross deficits  Assessment and plan:  # Type 2 diabetes Previously uncontrolled, likely A1c is improved, however pending. Continue metformin, consider adding Januvia (Janumet well covered) if needing more medication   HCM Recent mammogram and Pap smear at Dr. Dareen Piano at [facility]       Orders Placed This Encounter  Procedures  . Bayer [facility] Hb A1c Ellender Hose, MD [facility] 07/11/2017, 8:16 AM  Addendum A1c 8.2, change metformin to Janumet Nursing was called and updated the patient.  Murtis Sink, MD [facility] 07/11/2017, 8:59 AM

## 2017-07-11 NOTE — Telephone Encounter (Signed)
Patient aware that A1C is 8.2 and patient is to stop taking metformin and start janumet once received from [facility].  Per Dr. Ermalinda Memos.  Patient verbalized understanding.

## 2017-07-11 NOTE — Addendum Note (Signed)
Addended by: Elenora Gamma on: 07/11/2017 08:59 AM   Modules accepted: Orders

## 2017-07-19 ENCOUNTER — Encounter: Payer: Self-pay | Admitting: Family Medicine

## 2017-07-21 MED ORDER — GLIPIZIDE 5 MG PO TABS: 5.0000 mg | ORAL_TABLET | Freq: Every day | ORAL | 3 refills | Status: DC

## 2017-07-21 NOTE — Addendum Note (Signed)
Addended by: Elenora GammaBRADSHAW, SAMUEL L on: 07/21/2017 12:53 PM   Modules accepted: Orders

## 2017-08-07 ENCOUNTER — Encounter: Payer: Self-pay | Admitting: Family Medicine

## 2017-08-08 ENCOUNTER — Encounter: Payer: Self-pay | Admitting: Family Medicine

## 2017-08-15 ENCOUNTER — Other Ambulatory Visit: Payer: Self-pay | Admitting: Family Medicine

## 2017-08-29 ENCOUNTER — Ambulatory Visit (INDEPENDENT_AMBULATORY_CARE_PROVIDER_SITE_OTHER): Payer: BLUE CROSS/BLUE SHIELD

## 2017-08-29 DIAGNOSIS — Z23 Encounter for immunization: Secondary | ICD-10-CM

## 2017-09-19 DIAGNOSIS — B372 Candidiasis of skin and nail: Secondary | ICD-10-CM | POA: Diagnosis not present

## 2017-09-19 DIAGNOSIS — Z6841 Body Mass Index (BMI) 40.0 and over, adult: Secondary | ICD-10-CM | POA: Diagnosis not present

## 2017-10-13 ENCOUNTER — Ambulatory Visit: Payer: BLUE CROSS/BLUE SHIELD | Admitting: Family Medicine

## 2017-10-18 ENCOUNTER — Ambulatory Visit: Payer: BLUE CROSS/BLUE SHIELD | Admitting: Family Medicine

## 2017-10-20 ENCOUNTER — Encounter: Payer: Self-pay | Admitting: Family Medicine

## 2017-10-24 MED ORDER — METFORMIN HCL 1000 MG PO TABS: 1000.0000 mg | ORAL_TABLET | Freq: Two times a day (BID) | ORAL | 3 refills | Status: DC

## 2017-10-24 NOTE — Addendum Note (Signed)
Addended by: Elenora GammaBRADSHAW, SAMUEL L on: 10/24/2017 07:22 PM   Modules accepted: Orders

## 2018-02-11 ENCOUNTER — Other Ambulatory Visit: Payer: Self-pay | Admitting: Family Medicine

## 2018-02-12 NOTE — Telephone Encounter (Signed)
Left detailed message.   

## 2018-05-03 ENCOUNTER — Encounter: Payer: Self-pay | Admitting: Family Medicine

## 2018-05-03 ENCOUNTER — Ambulatory Visit: Payer: BLUE CROSS/BLUE SHIELD | Admitting: Family Medicine

## 2018-05-03 VITALS — BP 114/72 | HR 72 | Temp 96.9°F | Ht 67.0 in | Wt 302.2 lb

## 2018-05-03 DIAGNOSIS — E119 Type 2 diabetes mellitus without complications: Secondary | ICD-10-CM

## 2018-05-03 DIAGNOSIS — E785 Hyperlipidemia, unspecified: Secondary | ICD-10-CM

## 2018-05-03 DIAGNOSIS — I10 Essential (primary) hypertension: Secondary | ICD-10-CM

## 2018-05-03 LAB — BAYER DCA HB A1C WAIVED: HB A1C (BAYER DCA - WAIVED): 7 % — ABNORMAL HIGH (ref <7.0)

## 2018-05-03 MED ORDER — LISINOPRIL-HYDROCHLOROTHIAZIDE 20-12.5 MG PO TABS: 1.0000 | ORAL_TABLET | Freq: Every day | ORAL | 3 refills | Status: DC

## 2018-05-03 MED ORDER — METFORMIN HCL 1000 MG PO TABS: 1000.0000 mg | ORAL_TABLET | Freq: Two times a day (BID) | ORAL | 3 refills | Status: DC

## 2018-05-03 MED ORDER — GLIPIZIDE 5 MG PO TABS: 5.0000 mg | ORAL_TABLET | Freq: Every day | ORAL | 3 refills | Status: DC

## 2018-05-03 NOTE — Progress Notes (Signed)
   HPI  Patient presents today for follow-up chronic medical conditions.  Hypertension Good medication compliance and tolerance, no headache or chest pain.  Hyperlipidemia Watching diet moderately.  Type 2 diabetes. Good medication compliance, has some upset stomach at times with metformin but overall tolerates well. Watching diet moderately. She also feels is been too long since her follow-up  PMH: Smoking status noted ROS: Per HPI  Objective: BP 114/72   Pulse 72   Temp (!) 96.9 F (36.1 C) (Oral)   Ht _0  (1.702 m)   Wt (!) 302 lb 3.2 oz (137.1 kg)   BMI 47.33 kg/m  Gen: NAD, alert, cooperative with exam HEENT: NCAT, EOMI, PERRL CV: RRR, good S1/S2, no murmur Resp: CTABL, no wheezes, non-labored Ext: No edema, warm Neuro: Alert and oriented, No gross deficits  Diabetic Foot Exam - Simple   Simple Foot Form Diabetic Foot exam was performed with the following findings:  Yes 05/03/2018  8:10 AM  Visual Inspection No deformities, no ulcerations, no other skin breakdown bilaterally:  Yes Sensation Testing Intact to touch and monofilament testing bilaterally:  Yes Pulse Check Posterior Tibialis and Dorsalis pulse intact bilaterally:  Yes Comments      Assessment and plan:  #Type 2 diabetes Deviously uncontrolled, I am suspicious this still may be the case Consider GLP Continue glipizide metformin for now  #Hypertension Well-controlled Continue Prinzide  #Hyperlipidemia Diet controlled Labs, now that she is 51 she would technically be indicated for a statin.  Patient has colonoscopy scheduled in October with Dr. Paulita Fujita     Orders Placed This Encounter  Procedures  . Microalbumin / creatinine urine ratio  . Bayer DCA Hb A1c Waived  . CMP14+EGFR  . CBC with Differential/Platelet  . Lipid panel  . TSH    Meds ordered this encounter  Medications  . glipiZIDE (GLUCOTROL) 5 MG tablet    Sig: Take 1 tablet (5 mg total) by mouth daily before  breakfast.    Dispense:  90 tablet    Refill:  3  . lisinopril-hydrochlorothiazide (PRINZIDE,ZESTORETIC) 20-12.5 MG tablet    Sig: Take 1 tablet by mouth daily.    Dispense:  90 tablet    Refill:  3  . metFORMIN (GLUCOPHAGE) 1000 MG tablet    Sig: Take 1 tablet (1,000 mg total) by mouth 2 (two) times daily with a meal.    Dispense:  180 tablet    Refill:  [place], MD [facility] 05/03/2018, 8:09 AM

## 2018-05-03 NOTE — Patient Instructions (Signed)
Great to see you!  Come back to see Lawanna Kobusngel in 3-4 months

## 2018-05-04 LAB — LIPID PANEL
CHOL/HDL RATIO: 3.1 ratio (ref 0.0–4.4)
Cholesterol, Total: 132 mg/dL (ref 100–199)
HDL: 42 mg/dL (ref >39)
LDL CALC: 73 mg/dL (ref 0–99)
TRIGLYCERIDES: 85 mg/dL (ref 0–149)
VLDL CHOLESTEROL CAL: 17 mg/dL (ref 5–40)

## 2018-05-04 LAB — CBC WITH DIFFERENTIAL/PLATELET
Basophils Absolute: 0 10*3/uL (ref 0.0–0.2)
Basos: 0 %
EOS (ABSOLUTE): 0.1 10*3/uL (ref 0.0–0.4)
EOS: 2 %
HEMOGLOBIN: 12.2 g/dL (ref 11.1–15.9)
Hematocrit: 37.6 % (ref 34.0–46.6)
IMMATURE GRANS (ABS): 0 10*3/uL (ref 0.0–0.1)
Immature Granulocytes: 0 %
LYMPHS ABS: 1.4 10*3/uL (ref 0.7–3.1)
Lymphs: 28 %
MCH: 28.2 pg (ref 26.6–33.0)
MCHC: 32.4 g/dL (ref 31.5–35.7)
MCV: 87 fL (ref 79–97)
MONOCYTES: 5 %
Monocytes Absolute: 0.3 10*3/uL (ref 0.1–0.9)
Neutrophils Absolute: 3.3 10*3/uL (ref 1.4–7.0)
Neutrophils: 65 %
Platelets: 224 10*3/uL (ref 150–450)
RBC: 4.33 x10E6/uL (ref 3.77–5.28)
RDW: 14.6 % (ref 12.3–15.4)
WBC: 5.1 10*3/uL (ref 3.4–10.8)

## 2018-05-04 LAB — CMP14+EGFR
ALBUMIN: 4 g/dL (ref 3.5–5.5)
ALT: 12 IU/L (ref 0–32)
AST: 10 IU/L (ref 0–40)
Albumin/Globulin Ratio: 1.7 (ref 1.2–2.2)
Alkaline Phosphatase: 71 IU/L (ref 39–117)
BUN / CREAT RATIO: 19 (ref 9–23)
BUN: 13 mg/dL (ref 6–24)
Bilirubin Total: 0.5 mg/dL (ref 0.0–1.2)
CALCIUM: 8.9 mg/dL (ref 8.7–10.2)
CO2: 21 mmol/L (ref 20–29)
Chloride: 101 mmol/L (ref 96–106)
Creatinine, Ser: 0.69 mg/dL (ref 0.57–1.00)
GFR calc non Af Amer: 102 mL/min/{1.73_m2} (ref >59)
GFR, EST AFRICAN AMERICAN: 117 mL/min/{1.73_m2} (ref >59)
GLUCOSE: 144 mg/dL — AB (ref 65–99)
Globulin, Total: 2.4 g/dL (ref 1.5–4.5)
Potassium: 3.8 mmol/L (ref 3.5–5.2)
Sodium: 137 mmol/L (ref 134–144)
TOTAL PROTEIN: 6.4 g/dL (ref 6.0–8.5)

## 2018-05-04 LAB — MICROALBUMIN / CREATININE URINE RATIO
CREATININE, UR: 10.7 mg/dL
Microalb/Creat Ratio: <28 mg/g creat (ref 0.0–30.0)
Microalbumin, Urine: <3 ug/mL

## 2018-05-04 LAB — TSH: TSH: 2.5 u[IU]/mL (ref 0.450–4.500)

## 2018-06-27 DIAGNOSIS — Z01419 Encounter for gynecological examination (general) (routine) without abnormal findings: Secondary | ICD-10-CM | POA: Diagnosis not present

## 2018-06-27 DIAGNOSIS — Z124 Encounter for screening for malignant neoplasm of cervix: Secondary | ICD-10-CM | POA: Diagnosis not present

## 2018-06-27 DIAGNOSIS — N926 Irregular menstruation, unspecified: Secondary | ICD-10-CM | POA: Diagnosis not present

## 2018-06-27 DIAGNOSIS — Z6841 Body Mass Index (BMI) 40.0 and over, adult: Secondary | ICD-10-CM | POA: Diagnosis not present

## 2018-06-27 DIAGNOSIS — Z1231 Encounter for screening mammogram for malignant neoplasm of breast: Secondary | ICD-10-CM | POA: Diagnosis not present

## 2018-07-03 LAB — HM DIABETES EYE EXAM

## 2018-08-07 ENCOUNTER — Ambulatory Visit: Payer: BLUE CROSS/BLUE SHIELD | Admitting: Physician Assistant

## 2018-08-08 ENCOUNTER — Encounter: Payer: Self-pay | Admitting: Physician Assistant

## 2018-08-08 ENCOUNTER — Ambulatory Visit: Payer: BLUE CROSS/BLUE SHIELD | Admitting: Physician Assistant

## 2018-08-08 VITALS — BP 132/88 | HR 77 | Ht 67.0 in | Wt 309.6 lb

## 2018-08-08 DIAGNOSIS — I10 Essential (primary) hypertension: Secondary | ICD-10-CM

## 2018-08-08 DIAGNOSIS — E119 Type 2 diabetes mellitus without complications: Secondary | ICD-10-CM

## 2018-08-08 LAB — BAYER DCA HB A1C WAIVED: HB A1C (BAYER DCA - WAIVED): 8.7 % — ABNORMAL HIGH (ref <7.0)

## 2018-08-09 LAB — CBC WITH DIFFERENTIAL/PLATELET
Basophils Absolute: 0 10*3/uL (ref 0.0–0.2)
Basos: 1 %
EOS (ABSOLUTE): 0.2 10*3/uL (ref 0.0–0.4)
Eos: 3 %
HEMATOCRIT: 35.6 % (ref 34.0–46.6)
HEMOGLOBIN: 11.9 g/dL (ref 11.1–15.9)
IMMATURE GRANS (ABS): 0 10*3/uL (ref 0.0–0.1)
IMMATURE GRANULOCYTES: 0 %
LYMPHS: 31 %
Lymphocytes Absolute: 1.6 10*3/uL (ref 0.7–3.1)
MCH: 27.9 pg (ref 26.6–33.0)
MCHC: 33.4 g/dL (ref 31.5–35.7)
MCV: 83 fL (ref 79–97)
MONOCYTES: 6 %
Monocytes Absolute: 0.3 10*3/uL (ref 0.1–0.9)
NEUTROS ABS: 3.2 10*3/uL (ref 1.4–7.0)
Neutrophils: 59 %
PLATELETS: 228 10*3/uL (ref 150–450)
RBC: 4.27 x10E6/uL (ref 3.77–5.28)
RDW: 13.5 % (ref 12.3–15.4)
WBC: 5.3 10*3/uL (ref 3.4–10.8)

## 2018-08-09 LAB — CMP14+EGFR
A/G RATIO: 1.8 (ref 1.2–2.2)
ALT: 19 IU/L (ref 0–32)
AST: 19 IU/L (ref 0–40)
Albumin: 4 g/dL (ref 3.5–5.5)
Alkaline Phosphatase: 76 IU/L (ref 39–117)
BUN/Creatinine Ratio: 14 (ref 9–23)
BUN: 11 mg/dL (ref 6–24)
Bilirubin Total: 0.5 mg/dL (ref 0.0–1.2)
CALCIUM: 9.1 mg/dL (ref 8.7–10.2)
CO2: 23 mmol/L (ref 20–29)
Chloride: 100 mmol/L (ref 96–106)
Creatinine, Ser: 0.78 mg/dL (ref 0.57–1.00)
GFR calc Af Amer: 102 mL/min/{1.73_m2} (ref >59)
GFR, EST NON AFRICAN AMERICAN: 88 mL/min/{1.73_m2} (ref >59)
GLUCOSE: 183 mg/dL — AB (ref 65–99)
Globulin, Total: 2.2 g/dL (ref 1.5–4.5)
POTASSIUM: 4.1 mmol/L (ref 3.5–5.2)
Sodium: 140 mmol/L (ref 134–144)
Total Protein: 6.2 g/dL (ref 6.0–8.5)

## 2018-08-09 LAB — LIPID PANEL
CHOL/HDL RATIO: 3.1 ratio (ref 0.0–4.4)
Cholesterol, Total: 121 mg/dL (ref 100–199)
HDL: 39 mg/dL — ABNORMAL LOW (ref >39)
LDL Calculated: 60 mg/dL (ref 0–99)
TRIGLYCERIDES: 112 mg/dL (ref 0–149)
VLDL Cholesterol Cal: 22 mg/dL (ref 5–40)

## 2018-08-09 LAB — TSH: TSH: 2.65 u[IU]/mL (ref 0.450–4.500)

## 2018-08-09 NOTE — Progress Notes (Signed)
BP 132/88   Pulse 77   Ht '5\' 7"'  (1.702 m)   Wt (!) 309 lb 9.6 oz (140.4 kg)   BMI 48.49 kg/m    Subjective:    Patient ID: Debbie Hunt, female    DOB: Apr 16, 1966, 52 y.o.   MRN: [id number]  HPI: Debbie Hunt is a 52 y.o. female presenting on 08/08/2018 for Diabetes and Hypertension  This patient comes in for periodic recheck on medications and conditions including diabetes type 2 uncontrolled and hypertension. She reports that she has not been doing as well with diet and exercise and wants to get back on track..   All medications are reviewed today. There are no reports of any problems with the medications. All of the medical conditions are reviewed and updated.  Lab work is reviewed and will be ordered as medically necessary. There are no new problems reported with today's visit.   Past Medical History:  Diagnosis Date  . Diabetes mellitus without complication ([place])   . Hyperlipidemia   . Hypertension    Relevant past medical, surgical, family and social history reviewed and updated as indicated. Interim medical history since our last visit reviewed. Allergies and medications reviewed and updated. DATA REVIEWED: CHART IN EPIC  Family History reviewed for pertinent findings.  Review of Systems  Constitutional: Negative.   HENT: Negative.   Eyes: Negative.   Respiratory: Negative.   Gastrointestinal: Negative.   Genitourinary: Negative.     Allergies as of 08/08/2018      Reactions   Codeine    Penicillins Hives      Medication List        Accurate as of 08/08/18 11:59 PM. Always use your most recent med list.          aspirin EC 81 MG tablet Take 1 tablet (81 mg total) by mouth daily.   glipiZIDE 5 MG tablet Commonly known as:  GLUCOTROL Take 1 tablet (5 mg total) by mouth daily before breakfast.   hydrocortisone 25 MG suppository Commonly known as:  ANUSOL-HC Place 1 suppository (25 mg total) rectally 2 (two) times daily as needed for  hemorrhoids.   lisinopril-hydrochlorothiazide 20-12.5 MG tablet Commonly known as:  PRINZIDE,ZESTORETIC Take 1 tablet by mouth daily.   metFORMIN 1000 MG tablet Commonly known as:  GLUCOPHAGE Take 1 tablet (1,000 mg total) by mouth 2 (two) times daily with a meal.          Objective:    BP 132/88   Pulse 77   Ht '5\' 7"'  (1.702 m)   Wt (!) 309 lb 9.6 oz (140.4 kg)   BMI 48.49 kg/m   Allergies  Allergen Reactions  . Codeine   . Penicillins Hives    Wt Readings from Last 3 Encounters:  08/08/18 (!) 309 lb 9.6 oz (140.4 kg)  05/03/18 (!) 302 lb 3.2 oz (137.1 kg)  07/11/17 289 lb 9.6 oz (131.4 kg)    Physical Exam  Constitutional: She is oriented to person, place, and time. She appears well-developed and well-nourished.  HENT:  Head: Normocephalic and atraumatic.  Eyes: Pupils are equal, round, and reactive to light. Conjunctivae and EOM are normal.  Cardiovascular: Normal rate, regular rhythm, normal heart sounds and intact distal pulses.  Pulmonary/Chest: Effort normal and breath sounds normal.  Abdominal: Soft. Bowel sounds are normal.  Neurological: She is alert and oriented to person, place, and time. She has normal reflexes.  Skin: Skin is warm and dry. No rash noted.  Psychiatric: She has a normal mood and affect. Her behavior is normal. Judgment and thought content normal.    Results for orders placed or performed in visit on 08/08/18  CBC with Differential/Platelet  Result Value Ref Range   WBC 5.3 3.4 - 10.8 x10E3/uL   RBC 4.27 3.77 - 5.28 x10E6/uL   Hemoglobin 11.9 11.1 - 15.9 g/dL   Hematocrit 35.6 34.0 - 46.6 %   MCV 83 79 - 97 fL   MCH 27.9 26.6 - 33.0 pg   MCHC 33.4 31.5 - 35.7 g/dL   RDW 13.5 12.3 - 15.4 %   Platelets 228 150 - 450 x10E3/uL   Neutrophils 59 Not Estab. %   Lymphs 31 Not Estab. %   Monocytes 6 Not Estab. %   Eos 3 Not Estab. %   Basos 1 Not Estab. %   Neutrophils Absolute 3.2 1.4 - 7.0 x10E3/uL   Lymphocytes Absolute 1.6 0.7 -  3.1 x10E3/uL   Monocytes Absolute 0.3 0.1 - 0.9 x10E3/uL   EOS (ABSOLUTE) 0.2 0.0 - 0.4 x10E3/uL   Basophils Absolute 0.0 0.0 - 0.2 x10E3/uL   Immature Granulocytes 0 Not Estab. %   Immature Grans (Abs) 0.0 0.0 - 0.1 x10E3/uL  CMP14+EGFR  Result Value Ref Range   Glucose 183 (H) 65 - 99 mg/dL   BUN 11 6 - 24 mg/dL   Creatinine, Ser 0.78 0.57 - 1.00 mg/dL   GFR calc non Af Amer 88 >59 mL/min/1.73   GFR calc Af Amer 102 >59 mL/min/1.73   BUN/Creatinine Ratio 14 9 - 23   Sodium 140 134 - 144 mmol/L   Potassium 4.1 3.5 - 5.2 mmol/L   Chloride 100 96 - 106 mmol/L   CO2 23 20 - 29 mmol/L   Calcium 9.1 8.7 - 10.2 mg/dL   Total Protein 6.2 6.0 - 8.5 g/dL   Albumin 4.0 3.5 - 5.5 g/dL   Globulin, Total 2.2 1.5 - 4.5 g/dL   Albumin/Globulin Ratio 1.8 1.2 - 2.2   Bilirubin Total 0.5 0.0 - 1.2 mg/dL   Alkaline Phosphatase 76 39 - 117 IU/L   AST 19 0 - 40 IU/L   ALT 19 0 - 32 IU/L  Lipid panel  Result Value Ref Range   Cholesterol, Total 121 100 - 199 mg/dL   Triglycerides 112 0 - 149 mg/dL   HDL 39 (L) >39 mg/dL   VLDL Cholesterol Cal 22 5 - 40 mg/dL   LDL Calculated 60 0 - 99 mg/dL   Chol/HDL Ratio 3.1 0.0 - 4.4 ratio  TSH  Result Value Ref Range   TSH 2.650 0.450 - 4.500 uIU/mL  Bayer DCA Hb A1c Waived  Result Value Ref Range   HB A1C (BAYER DCA - WAIVED) 8.7 (H) <7.0 %      Assessment & Plan:   1. Type 2 diabetes mellitus without complication, unspecified whether long term insulin use (HCC) - CBC with Differential/Platelet - CMP14+EGFR - Lipid panel - TSH - Bayer DCA Hb A1c Waived  2. Essential hypertension - CBC with Differential/Platelet - CMP14+EGFR - Lipid panel - TSH - Bayer DCA Hb A1c Waived   Continue all other maintenance medications as listed above.  Follow up plan: Return in about 6 months (around 02/06/2019).  Educational handout given for [place] PA-C [place] [address]  [place], [place]  [id number] [phone]   08/09/2018, 9:41 PM

## 2018-08-20 ENCOUNTER — Ambulatory Visit (INDEPENDENT_AMBULATORY_CARE_PROVIDER_SITE_OTHER): Payer: BLUE CROSS/BLUE SHIELD

## 2018-08-20 DIAGNOSIS — Z23 Encounter for immunization: Secondary | ICD-10-CM

## 2018-08-23 ENCOUNTER — Encounter: Payer: Self-pay | Admitting: Physician Assistant

## 2018-08-23 DIAGNOSIS — K649 Unspecified hemorrhoids: Secondary | ICD-10-CM

## 2018-08-23 MED ORDER — HYDROCORTISONE ACETATE 25 MG RE SUPP: 25.0000 mg | Freq: Two times a day (BID) | RECTAL | 5 refills | Status: DC | PRN

## 2018-12-10 DIAGNOSIS — K648 Other hemorrhoids: Secondary | ICD-10-CM | POA: Diagnosis not present

## 2018-12-10 DIAGNOSIS — Z1211 Encounter for screening for malignant neoplasm of colon: Secondary | ICD-10-CM | POA: Diagnosis not present

## 2018-12-21 ENCOUNTER — Encounter: Payer: Self-pay | Admitting: *Deleted

## 2019-02-06 ENCOUNTER — Ambulatory Visit: Payer: BLUE CROSS/BLUE SHIELD | Admitting: Physician Assistant

## 2019-02-08 ENCOUNTER — Ambulatory Visit: Payer: BLUE CROSS/BLUE SHIELD | Admitting: Physician Assistant

## 2019-02-18 ENCOUNTER — Other Ambulatory Visit: Payer: Self-pay

## 2019-02-18 DIAGNOSIS — K649 Unspecified hemorrhoids: Secondary | ICD-10-CM

## 2019-02-18 NOTE — Telephone Encounter (Signed)
Last office visit:   08/08/2018

## 2019-02-19 MED ORDER — HYDROCORTISONE ACETATE 25 MG RE SUPP: 25.0000 mg | Freq: Two times a day (BID) | RECTAL | 2 refills | Status: DC | PRN

## 2019-03-06 ENCOUNTER — Encounter: Payer: Self-pay | Admitting: Physician Assistant

## 2019-03-18 ENCOUNTER — Other Ambulatory Visit: Payer: Self-pay

## 2019-03-18 ENCOUNTER — Encounter: Payer: Self-pay | Admitting: Physician Assistant

## 2019-03-18 ENCOUNTER — Ambulatory Visit (INDEPENDENT_AMBULATORY_CARE_PROVIDER_SITE_OTHER): Payer: BLUE CROSS/BLUE SHIELD | Admitting: Physician Assistant

## 2019-03-18 DIAGNOSIS — E119 Type 2 diabetes mellitus without complications: Secondary | ICD-10-CM | POA: Diagnosis not present

## 2019-03-18 DIAGNOSIS — I10 Essential (primary) hypertension: Secondary | ICD-10-CM | POA: Diagnosis not present

## 2019-03-18 MED ORDER — LISINOPRIL-HYDROCHLOROTHIAZIDE 20-12.5 MG PO TABS: 1.0000 | ORAL_TABLET | Freq: Every day | ORAL | 1 refills | Status: DC

## 2019-03-18 MED ORDER — METFORMIN HCL 1000 MG PO TABS: 1000.0000 mg | ORAL_TABLET | Freq: Two times a day (BID) | ORAL | 1 refills | Status: DC

## 2019-03-18 MED ORDER — GLIPIZIDE 5 MG PO TABS: 5.0000 mg | ORAL_TABLET | Freq: Every day | ORAL | 31 refills | Status: DC

## 2019-03-18 NOTE — Progress Notes (Signed)
8:45 AM no answer 8:47 AM no answer 8:58 AM no answer     Telephone visit  Subjective: CC: hypertension and diabetes PCP: Terald Sleeper, PA-C BHA:LPFX Debbie Hunt is a 53 y.o. female calls for telephone consult today. Patient provides verbal consent for consult held via phone.  Patient is identified with 2 separate identifiers.  At this time the entire area is on COVID-19 social distancing and stay home orders are in place.  Patient is of higher risk and therefore we are performing this by a virtual method.  Location of patient: Home Location of provider: HOME Others present for call: No  This is a periodic recheck for the patient's chronic medical conditions.  She does have known diabetes and hypertension.  She has been doing very well with her sugar readings.  She states that she has had a little bit more food indiscretions with being at home.  She does have significant amount of anxiety over the COVID-19 because of family members.  She was even trying to walk more and it just made her knee worse.  She states that overall she is doing well at this time.   ROS: Per HPI  Allergies  Allergen Reactions  . Codeine   . Penicillins Hives   Past Medical History:  Diagnosis Date  . Diabetes mellitus without complication ([place])   . Hyperlipidemia   . Hypertension     Current Outpatient Medications:  .  aspirin EC 81 MG tablet, Take 1 tablet (81 mg total) by mouth daily., Disp: , Rfl:  .  glipiZIDE (GLUCOTROL) 5 MG tablet, Take 1 tablet (5 mg total) by mouth daily before breakfast., Disp: 90 tablet, Rfl: 31 .  hydrocortisone (ANUSOL-HC) 25 MG suppository, Place 1 suppository (25 mg total) rectally 2 (two) times daily as needed for hemorrhoids., Disp: 12 suppository, Rfl: 2 .  lisinopril-hydrochlorothiazide (ZESTORETIC) 20-12.5 MG tablet, Take 1 tablet by mouth daily., Disp: 90 tablet, Rfl: 1 .  metFORMIN (GLUCOPHAGE) 1000 MG tablet, Take 1 tablet (1,000 mg total) by mouth 2 (two)  times daily with a meal., Disp: 180 tablet, Rfl: 1  Assessment/ Plan: 53 y.o. female   1. Type 2 diabetes mellitus without complication, unspecified whether long term insulin use (HCC) - glipiZIDE (GLUCOTROL) 5 MG tablet; Take 1 tablet (5 mg total) by mouth daily before breakfast.  Dispense: 90 tablet; Refill: 31 - metFORMIN (GLUCOPHAGE) 1000 MG tablet; Take 1 tablet (1,000 mg total) by mouth 2 (two) times daily with a meal.  Dispense: 180 tablet; Refill: 1 - CBC with Differential/Platelet; Future - CMP14+EGFR; Future - Lipid panel; Future - TSH; Future - Bayer DCA Hb A1c Waived; Future  2. Essential hypertension - lisinopril-hydrochlorothiazide (ZESTORETIC) 20-12.5 MG tablet; Take 1 tablet by mouth daily.  Dispense: 90 tablet; Refill: 1 - CBC with Differential/Platelet; Future - CMP14+EGFR; Future - Lipid panel; Future - TSH; Future - Bayer DCA Hb A1c Waived; Future   Start time: 9:10 AM End time: 9:19 AM  Meds ordered this encounter  Medications  . lisinopril-hydrochlorothiazide (ZESTORETIC) 20-12.5 MG tablet    Sig: Take 1 tablet by mouth daily.    Dispense:  90 tablet    Refill:  1    Order Specific Question:   Supervising Provider    Answer:   Janora Norlander [[id number]]  . glipiZIDE (GLUCOTROL) 5 MG tablet    Sig: Take 1 tablet (5 mg total) by mouth daily before breakfast.    Dispense:  90 tablet  Refill:  31    Order Specific Question:   Supervising Provider    Answer:   Janora Norlander [[id number]]  . metFORMIN (GLUCOPHAGE) 1000 MG tablet    Sig: Take 1 tablet (1,000 mg total) by mouth 2 (two) times daily with a meal.    Dispense:  180 tablet    Refill:  1    Order Specific Question:   Supervising Provider    Answer:   Janora Norlander [[id number]]    Particia Nearing PA-C [place] [phone]

## 2019-08-15 ENCOUNTER — Other Ambulatory Visit: Payer: Self-pay

## 2019-08-15 DIAGNOSIS — Z01419 Encounter for gynecological examination (general) (routine) without abnormal findings: Secondary | ICD-10-CM | POA: Diagnosis not present

## 2019-08-15 DIAGNOSIS — Z6841 Body Mass Index (BMI) 40.0 and over, adult: Secondary | ICD-10-CM | POA: Diagnosis not present

## 2019-08-15 DIAGNOSIS — Z124 Encounter for screening for malignant neoplasm of cervix: Secondary | ICD-10-CM | POA: Diagnosis not present

## 2019-08-16 ENCOUNTER — Ambulatory Visit: Payer: BC Managed Care – PPO | Admitting: Physician Assistant

## 2019-08-16 ENCOUNTER — Encounter: Payer: Self-pay | Admitting: Physician Assistant

## 2019-08-16 DIAGNOSIS — I10 Essential (primary) hypertension: Secondary | ICD-10-CM | POA: Diagnosis not present

## 2019-08-16 DIAGNOSIS — E119 Type 2 diabetes mellitus without complications: Secondary | ICD-10-CM | POA: Diagnosis not present

## 2019-08-16 DIAGNOSIS — Z23 Encounter for immunization: Secondary | ICD-10-CM

## 2019-08-16 LAB — BAYER DCA HB A1C WAIVED: HB A1C (BAYER DCA - WAIVED): 8.1 % — ABNORMAL HIGH (ref <7.0)

## 2019-08-16 MED ORDER — METFORMIN HCL 1000 MG PO TABS: 1000.0000 mg | ORAL_TABLET | Freq: Two times a day (BID) | ORAL | 1 refills | Status: DC

## 2019-08-16 MED ORDER — LISINOPRIL-HYDROCHLOROTHIAZIDE 20-12.5 MG PO TABS: 1.0000 | ORAL_TABLET | Freq: Every day | ORAL | 1 refills | Status: DC

## 2019-08-17 LAB — CBC WITH DIFFERENTIAL/PLATELET
Basophils Absolute: 0 10*3/uL (ref 0.0–0.2)
Basos: 1 %
EOS (ABSOLUTE): 0.1 10*3/uL (ref 0.0–0.4)
Eos: 2 %
Hematocrit: 38.3 % (ref 34.0–46.6)
Hemoglobin: 12.6 g/dL (ref 11.1–15.9)
Immature Grans (Abs): 0 10*3/uL (ref 0.0–0.1)
Immature Granulocytes: 0 %
Lymphocytes Absolute: 1.7 10*3/uL (ref 0.7–3.1)
Lymphs: 30 %
MCH: 28.3 pg (ref 26.6–33.0)
MCHC: 32.9 g/dL (ref 31.5–35.7)
MCV: 86 fL (ref 79–97)
Monocytes Absolute: 0.3 10*3/uL (ref 0.1–0.9)
Monocytes: 5 %
Neutrophils Absolute: 3.5 10*3/uL (ref 1.4–7.0)
Neutrophils: 62 %
Platelets: 217 10*3/uL (ref 150–450)
RBC: 4.45 x10E6/uL (ref 3.77–5.28)
RDW: 13.4 % (ref 11.7–15.4)
WBC: 5.8 10*3/uL (ref 3.4–10.8)

## 2019-08-17 LAB — CMP14+EGFR
ALT: 21 IU/L (ref 0–32)
AST: 17 IU/L (ref 0–40)
Albumin/Globulin Ratio: 2 (ref 1.2–2.2)
Albumin: 4.3 g/dL (ref 3.8–4.9)
Alkaline Phosphatase: 81 IU/L (ref 39–117)
BUN/Creatinine Ratio: 17 (ref 9–23)
BUN: 13 mg/dL (ref 6–24)
Bilirubin Total: 0.6 mg/dL (ref 0.0–1.2)
CO2: 22 mmol/L (ref 20–29)
Calcium: 9.9 mg/dL (ref 8.7–10.2)
Chloride: 104 mmol/L (ref 96–106)
Creatinine, Ser: 0.78 mg/dL (ref 0.57–1.00)
GFR calc Af Amer: 101 mL/min/{1.73_m2} (ref >59)
GFR calc non Af Amer: 88 mL/min/{1.73_m2} (ref >59)
Globulin, Total: 2.1 g/dL (ref 1.5–4.5)
Glucose: 171 mg/dL — ABNORMAL HIGH (ref 65–99)
Potassium: 3.9 mmol/L (ref 3.5–5.2)
Sodium: 140 mmol/L (ref 134–144)
Total Protein: 6.4 g/dL (ref 6.0–8.5)

## 2019-08-17 LAB — LIPID PANEL
Chol/HDL Ratio: 3 ratio (ref 0.0–4.4)
Cholesterol, Total: 121 mg/dL (ref 100–199)
HDL: 41 mg/dL (ref >39)
LDL Chol Calc (NIH): 64 mg/dL (ref 0–99)
Triglycerides: 83 mg/dL (ref 0–149)
VLDL Cholesterol Cal: 16 mg/dL (ref 5–40)

## 2019-08-17 LAB — TSH: TSH: 2 u[IU]/mL (ref 0.450–4.500)

## 2019-08-18 NOTE — Progress Notes (Signed)
BP (!) 153/86   Pulse 77   Temp 97.8 F (36.6 C) (Temporal)   Ht '5\' 7"'  (1.702 m)   Wt 293 lb 12.8 oz (133.3 kg)   SpO2 99%   BMI 46.02 kg/m    Subjective:    Patient ID: Debbie Hunt, female    DOB: 01/12/1966, 53 y.o.   MRN: [id number]  HPI: Debbie Hunt is a 53 y.o. female presenting on 08/16/2019 for Diabetes (6 month follow up)  This patient comes in for periodic follow-up on her chronic medical conditions which do include non-insulin-dependent diabetes, hypertension.  She currently is on Glucotrol, Glucophage and Zestoretic.  She reports that she has had fairly good readings with her blood pressure however today it was a little bit elevated.  I have asked her to keep a record of it checking it 2-3 times a week and if things do not come back down of 130/90 and under she is to let us know.  She states that she does not think her A1c will be very good and that she has not been eating very well.  She has a lot of stress going on.  Her company has file bankruptcy and she notes that she will be out of a job soon.  So she is looking for a new job.  She did have a colonoscopy in January 2020 and an eye exam also in January 2020.   Past Medical History:  Diagnosis Date  . Diabetes mellitus without complication ([place])   . Hyperlipidemia   . Hypertension    Relevant past medical, surgical, family and social history reviewed and updated as indicated. Interim medical history since our last visit reviewed. Allergies and medications reviewed and updated. DATA REVIEWED: CHART IN EPIC  Family History reviewed for pertinent findings.  Review of Systems  Constitutional: Negative.  Negative for activity change, fatigue and fever.  HENT: Negative.   Eyes: Negative.   Respiratory: Negative.  Negative for cough.   Cardiovascular: Negative.  Negative for chest pain.  Gastrointestinal: Negative.  Negative for abdominal pain.  Endocrine: Negative.   Genitourinary: Negative.  Negative for  dysuria.  Musculoskeletal: Negative.   Skin: Negative.   Neurological: Negative.     Allergies as of 08/16/2019      Reactions   Codeine    Penicillins Hives      Medication List       Accurate as of August 16, 2019 11:59 PM. If you have any questions, ask your nurse or doctor.        aspirin EC 81 MG tablet Take 1 tablet (81 mg total) by mouth daily.   glipiZIDE 5 MG tablet Commonly known as: GLUCOTROL Take 1 tablet (5 mg total) by mouth daily before breakfast.   hydrocortisone 25 MG suppository Commonly known as: ANUSOL-HC Place 1 suppository (25 mg total) rectally 2 (two) times daily as needed for hemorrhoids.   lisinopril-hydrochlorothiazide 20-12.5 MG tablet Commonly known as: ZESTORETIC Take 1 tablet by mouth daily.   metFORMIN 1000 MG tablet Commonly known as: GLUCOPHAGE Take 1 tablet (1,000 mg total) by mouth 2 (two) times daily with a meal.          Objective:    BP (!) 153/86   Pulse 77   Temp 97.8 F (36.6 C) (Temporal)   Ht '5\' 7"'  (1.702 m)   Wt 293 lb 12.8 oz (133.3 kg)   SpO2 99%   BMI 46.02 kg/m   Allergies  Allergen Reactions  .  Codeine   . Penicillins Hives    Wt Readings from Last 3 Encounters:  08/16/19 293 lb 12.8 oz (133.3 kg)  08/08/18 (!) 309 lb 9.6 oz (140.4 kg)  05/03/18 (!) 302 lb 3.2 oz (137.1 kg)    Physical Exam Constitutional:      General: She is not in acute distress.    Appearance: Normal appearance. She is well-developed.  HENT:     Head: Normocephalic and atraumatic.  Cardiovascular:     Rate and Rhythm: Normal rate.  Pulmonary:     Effort: Pulmonary effort is normal.  Skin:    General: Skin is warm and dry.     Findings: No rash.  Neurological:     Mental Status: She is alert and oriented to person, place, and time.     Deep Tendon Reflexes: Reflexes are normal and symmetric.     Diabetic Foot Exam - Simple   Simple Foot Form Diabetic Foot exam was performed with the following findings: Yes  08/16/2019  8:25 AM  Visual Inspection No deformities, no ulcerations, no other skin breakdown bilaterally: Yes Sensation Testing Intact to touch and monofilament testing bilaterally: Yes Pulse Check Posterior Tibialis and Dorsalis pulse intact bilaterally: Yes Comments     Results for orders placed or performed in visit on 08/16/19  Bayer DCA Hb A1c Waived  Result Value Ref Range   HB A1C (BAYER DCA - WAIVED) 8.1 (H) <7.0 %  TSH  Result Value Ref Range   TSH 2.000 0.450 - 4.500 uIU/mL  Lipid panel  Result Value Ref Range   Cholesterol, Total 121 100 - 199 mg/dL   Triglycerides 83 0 - 149 mg/dL   HDL 41 >39 mg/dL   VLDL Cholesterol Cal 16 5 - 40 mg/dL   LDL Chol Calc (NIH) 64 0 - 99 mg/dL   Chol/HDL Ratio 3.0 0.0 - 4.4 ratio  CMP14+EGFR  Result Value Ref Range   Glucose 171 (H) 65 - 99 mg/dL   BUN 13 6 - 24 mg/dL   Creatinine, Ser 0.78 0.57 - 1.00 mg/dL   GFR calc non Af Amer 88 >59 mL/min/1.73   GFR calc Af Amer 101 >59 mL/min/1.73   BUN/Creatinine Ratio 17 9 - 23   Sodium 140 134 - 144 mmol/L   Potassium 3.9 3.5 - 5.2 mmol/L   Chloride 104 96 - 106 mmol/L   CO2 22 20 - 29 mmol/L   Calcium 9.9 8.7 - 10.2 mg/dL   Total Protein 6.4 6.0 - 8.5 g/dL   Albumin 4.3 3.8 - 4.9 g/dL   Globulin, Total 2.1 1.5 - 4.5 g/dL   Albumin/Globulin Ratio 2.0 1.2 - 2.2   Bilirubin Total 0.6 0.0 - 1.2 mg/dL   Alkaline Phosphatase 81 39 - 117 IU/L   AST 17 0 - 40 IU/L   ALT 21 0 - 32 IU/L  CBC with Differential/Platelet  Result Value Ref Range   WBC 5.8 3.4 - 10.8 x10E3/uL   RBC 4.45 3.77 - 5.28 x10E6/uL   Hemoglobin 12.6 11.1 - 15.9 g/dL   Hematocrit 38.3 34.0 - 46.6 %   MCV 86 79 - 97 fL   MCH 28.3 26.6 - 33.0 pg   MCHC 32.9 31.5 - 35.7 g/dL   RDW 13.4 11.7 - 15.4 %   Platelets 217 150 - 450 x10E3/uL   Neutrophils 62 Not Estab. %   Lymphs 30 Not Estab. %   Monocytes 5 Not Estab. %   Eos 2 Not Estab. %  Basos 1 Not Estab. %   Neutrophils Absolute 3.5 1.4 - 7.0 x10E3/uL    Lymphocytes Absolute 1.7 0.7 - 3.1 x10E3/uL   Monocytes Absolute 0.3 0.1 - 0.9 x10E3/uL   EOS (ABSOLUTE) 0.1 0.0 - 0.4 x10E3/uL   Basophils Absolute 0.0 0.0 - 0.2 x10E3/uL   Immature Granulocytes 0 Not Estab. %   Immature Grans (Abs) 0.0 0.0 - 0.1 x10E3/uL      Assessment & Plan:   1. Essential hypertension - lisinopril-hydrochlorothiazide (ZESTORETIC) 20-12.5 MG tablet; Take 1 tablet by mouth daily.  Dispense: 90 tablet; Refill: 1 - Bayer DCA Hb A1c Waived - TSH - Lipid panel - CMP14+EGFR - CBC with Differential/Platelet  2. Type 2 diabetes mellitus without complication, unspecified whether long term insulin use (HCC) - metFORMIN (GLUCOPHAGE) 1000 MG tablet; Take 1 tablet (1,000 mg total) by mouth 2 (two) times daily with a meal.  Dispense: 180 tablet; Refill: 1 - Bayer DCA Hb A1c Waived - TSH - Lipid panel - CMP14+EGFR - CBC with Differential/Platelet  3. Need for immunization against influenza - Flu Vaccine QUAD 36+ mos IM   Continue all other maintenance medications as listed above.  Follow up plan: Return in about 6 months (around 02/13/2020).  Educational handout given for carb counting  Terald Sleeper PA-C [facility] [address]  [place], [place] [address] [phone]   08/18/2019, 11:03 PM

## 2019-08-20 DIAGNOSIS — Z1231 Encounter for screening mammogram for malignant neoplasm of breast: Secondary | ICD-10-CM | POA: Diagnosis not present

## 2019-09-26 ENCOUNTER — Other Ambulatory Visit: Payer: Self-pay | Admitting: Physician Assistant

## 2019-11-04 ENCOUNTER — Encounter: Payer: Self-pay | Admitting: Family Medicine

## 2019-11-04 ENCOUNTER — Ambulatory Visit (INDEPENDENT_AMBULATORY_CARE_PROVIDER_SITE_OTHER): Payer: BC Managed Care – PPO | Admitting: Family Medicine

## 2019-11-04 DIAGNOSIS — J019 Acute sinusitis, unspecified: Secondary | ICD-10-CM | POA: Diagnosis not present

## 2019-11-04 MED ORDER — PREDNISONE 10 MG (21) PO TBPK: ORAL_TABLET | ORAL | 0 refills | Status: DC

## 2019-11-04 MED ORDER — AZITHROMYCIN 250 MG PO TABS: ORAL_TABLET | ORAL | 0 refills | Status: DC

## 2019-11-04 NOTE — Progress Notes (Signed)
Virtual Visit via Telephone Note  I connected with Debbie Hunt on 11/04/19 at 8:15 AM by telephone and verified that I am speaking with the correct person using two identifiers. Debbie Hunt is currently located at home and her husband is currently with her during this visit, which she is okay with. The provider, Gwenlyn Fudge, FNP is located in their home at time of visit.  I discussed the limitations, risks, security and privacy concerns of performing an evaluation and management service by telephone and the availability of in person appointments. I also discussed with the patient that there may be a patient responsible charge related to this service. The patient expressed understanding and agreed to proceed.  Subjective: PCP: Remus Loffler, PA-C  Chief Complaint  Patient presents with  . Sinus Problem   Patient complains of head congestion, ear pain/pressure, facial pain/pressure and body aches and fatigue. Additional symptoms include headache, runny nose, fever, postnasal drainage, nausea, loss of taste and loss of smell. Onset of symptoms was 1 week ago, unchanged since that time. She is drinking plenty of fluids. Evaluation to date: none. Treatment to date: Advil Sinus & Mucinex. She does not have a history of asthma, allergies, or COPD. She does not smoke.     ROS: Per HPI  Current Outpatient Medications:  .  aspirin EC 81 MG tablet, Take 1 tablet (81 mg total) by mouth daily., Disp: , Rfl:  .  clotrimazole-betamethasone (LOTRISONE) cream, APPLY TO THE AFFECTED AND SURROUNDING AREAS OF SKIN THREE TIMES DAILY AS NEEDED, Disp: , Rfl:  .  ergocalciferol (VITAMIN D2) 1.25 MG (50000 UT) capsule, ergocalciferol (vitamin D2) 1,250 mcg (50,000 unit) capsule, Disp: , Rfl:  .  glipiZIDE (GLUCOTROL) 5 MG tablet, Take 1 tablet (5 mg total) by mouth daily before breakfast., Disp: 90 tablet, Rfl: 31 .  hydrocortisone (ANUSOL-HC) 25 MG suppository, Place 1 suppository (25 mg total)  rectally 2 (two) times daily as needed for hemorrhoids., Disp: 12 suppository, Rfl: 2 .  lisinopril-hydrochlorothiazide (ZESTORETIC) 20-12.5 MG tablet, Take 1 tablet by mouth daily., Disp: 90 tablet, Rfl: 1 .  metFORMIN (GLUCOPHAGE) 1000 MG tablet, Take 1 tablet (1,000 mg total) by mouth 2 (two) times daily with a meal., Disp: 180 tablet, Rfl: 1  Allergies  Allergen Reactions  . Codeine   . Penicillins Hives   Past Medical History:  Diagnosis Date  . Diabetes mellitus without complication (HCC)   . Hyperlipidemia   . Hypertension     Observations/Objective: A&O  No respiratory distress or wheezing audible over the phone Mood, judgement, and thought processes all WNL  Assessment and Plan: 1. Acute non-recurrent sinusitis, unspecified location - Encouraged patient to get tested for COVID-19, she is planning to do this since she is able to work from home. Education provided on sinusitis. Symptom management.  - predniSONE (STERAPRED UNI-PAK 21 TAB) 10 MG (21) TBPK tablet; Use as directed on back of pill pack  Dispense: 21 tablet; Refill: 0 - azithromycin (ZITHROMAX) 250 MG tablet; As directed  Dispense: 6 tablet; Refill: 0   Follow Up Instructions:  I discussed the assessment and treatment plan with the patient. The patient was provided an opportunity to ask questions and all were answered. The patient agreed with the plan and demonstrated an understanding of the instructions.   The patient was advised to call back or seek an in-person evaluation if the symptoms worsen or if the condition fails to improve as anticipated.  The above assessment  and management plan was discussed with the patient. The patient verbalized understanding of and has agreed to the management plan. Patient is aware to call the clinic if symptoms persist or worsen. Patient is aware when to return to the clinic for a follow-up visit. Patient educated on when it is appropriate to go to the emergency department.    Time call ended: 8:23 AM  I provided 13 minutes of non-face-to-face time during this encounter.  Hendricks Limes, MSN, APRN, FNP-C [facility] 11/04/19

## 2019-11-04 NOTE — Patient Instructions (Signed)

## 2019-11-07 ENCOUNTER — Other Ambulatory Visit: Payer: Self-pay

## 2019-11-11 ENCOUNTER — Encounter: Payer: Self-pay | Admitting: Family Medicine

## 2019-12-11 ENCOUNTER — Encounter: Payer: Self-pay | Admitting: Physician Assistant

## 2020-02-09 ENCOUNTER — Other Ambulatory Visit: Payer: Self-pay | Admitting: Physician Assistant

## 2020-02-09 DIAGNOSIS — E119 Type 2 diabetes mellitus without complications: Secondary | ICD-10-CM

## 2020-02-13 ENCOUNTER — Ambulatory Visit: Payer: BC Managed Care – PPO | Admitting: Physician Assistant

## 2020-03-06 ENCOUNTER — Other Ambulatory Visit: Payer: Self-pay | Admitting: *Deleted

## 2020-03-06 DIAGNOSIS — E119 Type 2 diabetes mellitus without complications: Secondary | ICD-10-CM

## 2020-03-06 MED ORDER — METFORMIN HCL 1000 MG PO TABS: 1000.0000 mg | ORAL_TABLET | Freq: Two times a day (BID) | ORAL | 0 refills | Status: DC

## 2020-03-06 NOTE — Telephone Encounter (Signed)
Ov 05/01/20

## 2020-03-17 ENCOUNTER — Ambulatory Visit: Payer: BC Managed Care – PPO | Admitting: Family Medicine

## 2020-05-01 ENCOUNTER — Ambulatory Visit: Payer: BC Managed Care – PPO | Admitting: Family Medicine

## 2020-05-01 ENCOUNTER — Encounter: Payer: Self-pay | Admitting: Family Medicine

## 2020-05-01 ENCOUNTER — Other Ambulatory Visit: Payer: Self-pay

## 2020-05-01 VITALS — BP 134/86 | HR 82 | Temp 97.7°F | Ht 67.0 in | Wt 285.6 lb

## 2020-05-01 DIAGNOSIS — E1169 Type 2 diabetes mellitus with other specified complication: Secondary | ICD-10-CM

## 2020-05-01 DIAGNOSIS — Z7689 Persons encountering health services in other specified circumstances: Secondary | ICD-10-CM | POA: Diagnosis not present

## 2020-05-01 DIAGNOSIS — E785 Hyperlipidemia, unspecified: Secondary | ICD-10-CM

## 2020-05-01 DIAGNOSIS — E1159 Type 2 diabetes mellitus with other circulatory complications: Secondary | ICD-10-CM | POA: Diagnosis not present

## 2020-05-01 DIAGNOSIS — E119 Type 2 diabetes mellitus without complications: Secondary | ICD-10-CM

## 2020-05-01 DIAGNOSIS — I10 Essential (primary) hypertension: Secondary | ICD-10-CM | POA: Diagnosis not present

## 2020-05-01 LAB — BAYER DCA HB A1C WAIVED: HB A1C (BAYER DCA - WAIVED): 10.3 % — ABNORMAL HIGH (ref <7.0)

## 2020-05-01 MED ORDER — ACCU-CHEK SOFTCLIX LANCETS MISC: 12 refills | Status: DC

## 2020-05-01 MED ORDER — DAPAGLIFLOZIN PROPANEDIOL 5 MG PO TABS: 5.0000 mg | ORAL_TABLET | Freq: Every day | ORAL | 0 refills | Status: DC

## 2020-05-01 MED ORDER — GLUCOSE BLOOD VI STRP: ORAL_STRIP | 12 refills | Status: DC

## 2020-05-01 MED ORDER — LISINOPRIL-HYDROCHLOROTHIAZIDE 20-12.5 MG PO TABS: 1.0000 | ORAL_TABLET | Freq: Every day | ORAL | 1 refills | Status: DC

## 2020-05-01 MED ORDER — METFORMIN HCL 1000 MG PO TABS: 1000.0000 mg | ORAL_TABLET | Freq: Two times a day (BID) | ORAL | 0 refills | Status: DC

## 2020-05-01 NOTE — Progress Notes (Signed)
Subjective: CC: est care, DM PCP: Janora Norlander, DO TTS:VXBL Debbie Hunt is a 54 y.o. female presenting to clinic today for:  1. Type 2 Diabetes w/ HTN, HLD:  Patient reports compliance with glipizide 5 mg daily, Metformin 1000 mg twice daily and lisinopril-hydrochlorothiazide 20-12.5 mg daily.  She is not currently on a statin.  Her A1c was not at goal last visit.  However, apparently no real changes were planned in her medications.  She does note that she was supposed to modify some diet and exercise but over the last 6 months she has had several deaths in the family and has been emotionally eating.  She does not have a meter anymore because hers broke after she was sick with Covid and inadvertently put the strip in the wrong way.  She does need a new one.  Denies any blurred vision, chest pain, shortness of breath, neuropathy.  No history of diabetic ketoacidosis, pancreatitis, retinopathy.  No history of recurrent UTIs, vaginitis.  No known family history of MEN, medullary thyroid cancer.  Last eye exam: needs; usually sees my eye doctor but does not have an appointment until August Last foot exam: UTD Last A1c:  Lab Results  Component Value Date   HGBA1C 8.1 (H) 08/16/2019   Nephropathy screen indicated?: on ACE-I Last flu, zoster and/or pneumovax:  Immunization History  Administered Date(s) Administered  . Influenza Whole 08/23/2010  . Influenza,inj,Quad PF,6+ Mos 09/02/2013, 08/27/2014, 08/25/2015, 08/11/2016, 08/29/2017, 08/20/2018, 08/16/2019  . Influenza-Unspecified 08/21/2012  . Pneumococcal Polysaccharide-23 08/16/2019     ROS: Per HPI  Allergies  Allergen Reactions  . Codeine   . Penicillins Hives   Past Medical History:  Diagnosis Date  . Diabetes mellitus without complication ([place])   . Hyperlipidemia   . Hypertension     Current Outpatient Medications:  .  aspirin EC 81 MG tablet, Take 1 tablet (81 mg total) by mouth daily., Disp: , Rfl:  .   azithromycin (ZITHROMAX) 250 MG tablet, As directed, Disp: 6 tablet, Rfl: 0 .  clotrimazole-betamethasone (LOTRISONE) cream, APPLY TO THE AFFECTED AND SURROUNDING AREAS OF SKIN THREE TIMES DAILY AS NEEDED, Disp: , Rfl:  .  ergocalciferol (VITAMIN D2) 1.25 MG (50000 UT) capsule, ergocalciferol (vitamin D2) 1,250 mcg (50,000 unit) capsule, Disp: , Rfl:  .  glipiZIDE (GLUCOTROL) 5 MG tablet, Take 1 tablet (5 mg total) by mouth daily before breakfast., Disp: 90 tablet, Rfl: 31 .  hydrocortisone (ANUSOL-HC) 25 MG suppository, Place 1 suppository (25 mg total) rectally 2 (two) times daily as needed for hemorrhoids., Disp: 12 suppository, Rfl: 2 .  lisinopril-hydrochlorothiazide (ZESTORETIC) 20-12.5 MG tablet, Take 1 tablet by mouth daily., Disp: 90 tablet, Rfl: 1 .  metFORMIN (GLUCOPHAGE) 1000 MG tablet, Take 1 tablet (1,000 mg total) by mouth 2 (two) times daily with a meal., Disp: 180 tablet, Rfl: 0 .  predniSONE (STERAPRED UNI-PAK 21 TAB) 10 MG (21) TBPK tablet, Use as directed on back of pill pack, Disp: 21 tablet, Rfl: 0 Social History   Socioeconomic History  . Marital status: Married    Spouse name: Not on file  . Number of children: Not on file  . Years of education: Not on file  . Highest education level: Not on file  Occupational History  . Not on file  Tobacco Use  . Smoking status: Never Smoker  . Smokeless tobacco: Never Used  Vaping Use  . Vaping Use: Never used  Substance and Sexual Activity  . Alcohol use: No  .  Drug use: No  . Sexual activity: Yes  Other Topics Concern  . Not on file  Social History Narrative  . Not on file   Social Determinants of Health   Financial Resource Strain:   . Difficulty of Paying Living Expenses:   Food Insecurity:   . Worried About Programme researcher, broadcasting/film/video in the Last Year:   . Barista in the Last Year:   Transportation Needs:   . Freight forwarder (Medical):   Marland Kitchen Lack of Transportation (Non-Medical):   Physical Activity:   .  Days of Exercise per Week:   . Minutes of Exercise per Session:   Stress:   . Feeling of Stress :   Social Connections:   . Frequency of Communication with Friends and Family:   . Frequency of Social Gatherings with Friends and Family:   . Attends Religious Services:   . Active Member of Clubs or Organizations:   . Attends Banker Meetings:   Marland Kitchen Marital Status:   Intimate Partner Violence:   . Fear of Current or Ex-Partner:   . Emotionally Abused:   Marland Kitchen Physically Abused:   . Sexually Abused:    Family History  Problem Relation Age of Onset  . Diabetes Mother   . Hypertension Mother     Objective: Office vital signs reviewed. BP 134/86   Pulse 82   Temp 97.7 F (36.5 C)   Ht 5\' 7"  (1.702 m)   Wt 285 lb 9.6 oz (129.5 kg)   SpO2 97%   BMI 44.73 kg/m   Physical Examination:  General: Awake, alert, well nourished, No acute distress HEENT: Normal; sclera white.  Moist mucous membranes. Cardio: regular rate and rhythm, S1S2 heard, no murmurs appreciated Pulm: clear to auscultation bilaterally, no wheezes, rhonchi or rales; normal work of breathing on room air Extremities: warm, well perfused, No edema, cyanosis or clubbing; +2 pulses bilaterally MSK: normal gait and station  Assessment/ Plan: 54 y.o. female   1. Type 2 diabetes mellitus with other specified complication, without long-term current use of insulin (HCC) Not controlled.  A1c with an abrupt rise to 10.6 today.  Have given her a meter today, Accu-Chek.  I will send in testing supplies.  I would like her to check blood sugars daily as we are starting 44.  I would like her to discontinue glipizide.  She will continue Metformin.  I anticipate that we will ultimately either increase the Farxiga to 10 mg or put her in combination with a Lipton.  I have a co-pay card for her which should bring because down to 0 but we will wait to see how her tolerance is of the [place] prior to putting her on a combo  pill.  She understands the risks of the medications and reasons to contact me.  Otherwise, plan to follow-up in 4 weeks for review of blood sugar log. - Bayer DCA Hb A1c Waived  2. Hypertension associated with diabetes (HCC) Controlled.  Continue current regimen  3. Hyperlipidemia associated with type 2 diabetes mellitus (HCC) We will plan for statin.  Unsure as to why she is not on 1.  Her LDL is below 70 but gold standard of treatment is that all diabetics should be on some type of statin.  May consider something like Pravachol.  4. Establishing care with new doctor, encounter for   Orders Placed This Encounter  Procedures  . Bayer DCA Hb A1c Waived   Meds ordered this encounter  Medications  . dapagliflozin propanediol (FARXIGA) 5 MG TABS tablet    Sig: Take 1 tablet (5 mg total) by mouth daily before breakfast.    Dispense:  28 tablet    Refill:  0   Patient will get her Pap and mammogram done with her OB/GYN at [facility] in [place].  She will have these sent over in July.  Raliegh Ip, DO [facility] [phone]

## 2020-05-01 NOTE — Patient Instructions (Signed)

## 2020-05-07 ENCOUNTER — Encounter: Payer: Self-pay | Admitting: Family Medicine

## 2020-05-12 ENCOUNTER — Other Ambulatory Visit: Payer: Self-pay | Admitting: Family Medicine

## 2020-05-12 ENCOUNTER — Telehealth: Payer: Self-pay | Admitting: Pharmacist

## 2020-05-12 MED ORDER — FLUCONAZOLE 150 MG PO TABS
150.0000 mg | ORAL_TABLET | Freq: Once | ORAL | 0 refills | Status: AC
Start: 2020-05-12 — End: 2020-05-12

## 2020-05-12 NOTE — Telephone Encounter (Signed)
Please schedule patient to see pharmD (A1c 10.3%) per PCP--we will likely need to change/start new medicine  30 min appt please.  Thank you!!

## 2020-05-12 NOTE — Telephone Encounter (Signed)
Patient states that she reached out to Dr. Nadine Counts about farxiga causing yeast like symptoms.  Patient states that she is no longer having these symptoms and would like to give it a few more days before scheduling appt

## 2020-05-12 NOTE — Progress Notes (Unsigned)
Absolutely! Sent to pools for scheduling & made notes :)

## 2020-05-13 ENCOUNTER — Encounter: Payer: Self-pay | Admitting: Family Medicine

## 2020-05-13 ENCOUNTER — Other Ambulatory Visit: Payer: Self-pay

## 2020-05-13 DIAGNOSIS — E119 Type 2 diabetes mellitus without complications: Secondary | ICD-10-CM

## 2020-05-13 DIAGNOSIS — I10 Essential (primary) hypertension: Secondary | ICD-10-CM

## 2020-05-13 DIAGNOSIS — E1169 Type 2 diabetes mellitus with other specified complication: Secondary | ICD-10-CM

## 2020-05-13 MED ORDER — ACCU-CHEK SOFTCLIX LANCETS MISC: 12 refills | Status: AC

## 2020-05-13 MED ORDER — LISINOPRIL-HYDROCHLOROTHIAZIDE 20-12.5 MG PO TABS: 1.0000 | ORAL_TABLET | Freq: Every day | ORAL | 1 refills | Status: DC

## 2020-05-13 MED ORDER — GLUCOSE BLOOD VI STRP: ORAL_STRIP | 12 refills | Status: DC

## 2020-05-13 MED ORDER — METFORMIN HCL 1000 MG PO TABS: 1000.0000 mg | ORAL_TABLET | Freq: Two times a day (BID) | ORAL | 0 refills | Status: DC

## 2020-05-21 ENCOUNTER — Encounter: Payer: Self-pay | Admitting: Family Medicine

## 2020-05-22 ENCOUNTER — Other Ambulatory Visit: Payer: Self-pay | Admitting: Family Medicine

## 2020-05-22 MED ORDER — DAPAGLIFLOZIN PROPANEDIOL 10 MG PO TABS: 10.0000 mg | ORAL_TABLET | Freq: Every day | ORAL | 0 refills | Status: DC

## 2020-05-29 ENCOUNTER — Ambulatory Visit (INDEPENDENT_AMBULATORY_CARE_PROVIDER_SITE_OTHER): Payer: BC Managed Care – PPO | Admitting: Family Medicine

## 2020-05-29 DIAGNOSIS — E1169 Type 2 diabetes mellitus with other specified complication: Secondary | ICD-10-CM | POA: Diagnosis not present

## 2020-05-29 MED ORDER — DAPAGLIFLOZIN PROPANEDIOL 10 MG PO TABS: 10.0000 mg | ORAL_TABLET | Freq: Every day | ORAL | 0 refills | Status: DC

## 2020-05-29 NOTE — Progress Notes (Signed)
Telephone visit  Subjective: CC: DM PCP: Raliegh Ip, DO ZOX:WRUE Debbie Hunt is a 54 y.o. female calls for telephone consult today. Patient provides verbal consent for consult held via phone.  Due to COVID-19 pandemic this visit was conducted virtually. This visit type was conducted due to national recommendations for restrictions regarding the COVID-19 Pandemic (e.g. social distancing, sheltering in place) in an effort to limit this patient's exposure and mitigate transmission in our community. All issues noted in this document were discussed and addressed.  A physical exam was not performed with this format.   Location of patient: home Location of provider: [facility] Others present for call: spouse  1. Sugar review. Patient reports that she just switched to the 10 mg today.  Blood sugars are doing ok.  Too soon to tell with the 10 mg but the lowest she saw on the 5mg  was 145 and the highest was 194 (this was immediately following her meal). She denies vaginal symptoms.  She has just started a journal/ food log.  She plans on switching to 5-6 small meals.  No nausea, vomiting.  She occasionally gets lightheaded if she goes too long without eating.  ROS: Per HPI  Allergies  Allergen Reactions  . Codeine   . Penicillins Hives   Past Medical History:  Diagnosis Date  . Diabetes mellitus without complication (HCC)   . Hyperlipidemia   . Hypertension     Current Outpatient Medications:  .  Accu-Chek Softclix Lancets lancets, Use as instructed to check BGs daily. E11.9, Disp: 100 each, Rfl: 12 .  aspirin EC 81 MG tablet, Take 1 tablet (81 mg total) by mouth daily., Disp: , Rfl:  .  clotrimazole-betamethasone (LOTRISONE) cream, APPLY TO THE AFFECTED AND SURROUNDING AREAS OF SKIN THREE TIMES DAILY AS NEEDED, Disp: , Rfl:  .  dapagliflozin propanediol (FARXIGA) 10 MG TABS tablet, Take 1 tablet (10 mg total) by mouth daily before breakfast., Disp: 21 tablet, Rfl: 0 .  ergocalciferol  (VITAMIN D2) 1.25 MG (50000 UT) capsule, ergocalciferol (vitamin D2) 1,250 mcg (50,000 unit) capsule, Disp: , Rfl:  .  glucose blood test strip, Check BGs once daily E11.9 (has accucheck machine), Disp: 100 each, Rfl: 12 .  hydrocortisone (ANUSOL-HC) 25 MG suppository, Place 1 suppository (25 mg total) rectally 2 (two) times daily as needed for hemorrhoids., Disp: 12 suppository, Rfl: 2 .  lisinopril-hydrochlorothiazide (ZESTORETIC) 20-12.5 MG tablet, Take 1 tablet by mouth daily., Disp: 90 tablet, Rfl: 1 .  metFORMIN (GLUCOPHAGE) 1000 MG tablet, Take 1 tablet (1,000 mg total) by mouth 2 (two) times daily with a meal., Disp: 180 tablet, Rfl: 0  Assessment/ Plan: 54 y.o. female   1. Type 2 diabetes mellitus with other specified complication, without long-term current use of insulin (HCC) Blood sugars remain uncontrolled but I think that with the increased dose of the Farxiga we may be able to get her to goal.  I have scheduled her 79-month follow-up in September, she will come in for fasting labs at that point and we will obtain lipid, A1c etc.  I have sent in a 30-day supply to the local pharmacy with a 30-day free coupon voucher.  She will contact me in the next couple of weeks to let me know how the 10 mg are going and if I can go ahead and proceed with sending this to her mail order pharmacy.  Start time: 10:58am End time: 11:06am  Total time spent on patient care (including telephone call/ virtual visit): 11  minutes  Janora Norlander, DO [place] [phone]

## 2020-06-12 ENCOUNTER — Encounter: Payer: Self-pay | Admitting: Family Medicine

## 2020-06-12 ENCOUNTER — Other Ambulatory Visit: Payer: Self-pay | Admitting: *Deleted

## 2020-06-12 MED ORDER — DAPAGLIFLOZIN PROPANEDIOL 10 MG PO TABS: 10.0000 mg | ORAL_TABLET | Freq: Every day | ORAL | 0 refills | Status: DC

## 2020-06-15 ENCOUNTER — Other Ambulatory Visit: Payer: Self-pay | Admitting: Family Medicine

## 2020-06-15 ENCOUNTER — Encounter: Payer: Self-pay | Admitting: Family Medicine

## 2020-06-15 MED ORDER — FLUCONAZOLE 150 MG PO TABS
150.0000 mg | ORAL_TABLET | Freq: Once | ORAL | 0 refills | Status: AC
Start: 2020-06-15 — End: 2020-06-15

## 2020-06-16 ENCOUNTER — Other Ambulatory Visit: Payer: Self-pay | Admitting: Family Medicine

## 2020-06-16 ENCOUNTER — Telehealth: Payer: Self-pay | Admitting: Family Medicine

## 2020-06-16 ENCOUNTER — Other Ambulatory Visit: Payer: Self-pay

## 2020-06-16 DIAGNOSIS — E1169 Type 2 diabetes mellitus with other specified complication: Secondary | ICD-10-CM

## 2020-06-16 MED ORDER — DAPAGLIFLOZIN PROPANEDIOL 10 MG PO TABS: 10.0000 mg | ORAL_TABLET | Freq: Every day | ORAL | 3 refills | Status: DC

## 2020-06-16 MED ORDER — DAPAGLIFLOZIN PROPANEDIOL 10 MG PO TABS: 10.0000 mg | ORAL_TABLET | Freq: Every day | ORAL | 0 refills | Status: DC

## 2020-06-16 NOTE — Telephone Encounter (Signed)
Patient called stating that she recently spoke to [place] about getting her [name] Rx [facility] to [facility] [place]. Pt says she still hasnt received the Rx and [facility] and was told that they never [facility] Rx. Can we res[facility] [place] [facility] [place]. Pt is almost out.

## 2020-06-16 NOTE — Telephone Encounter (Signed)
Mickle Plumb [facility]. Pt no longer uses them. [facility]  [facility] because she states that she has completely ran out.

## 2020-06-19 ENCOUNTER — Other Ambulatory Visit: Payer: Self-pay | Admitting: *Deleted

## 2020-06-19 MED ORDER — DAPAGLIFLOZIN PROPANEDIOL 10 MG PO TABS: 10.0000 mg | ORAL_TABLET | Freq: Every day | ORAL | 0 refills | Status: DC

## 2020-08-05 ENCOUNTER — Other Ambulatory Visit: Payer: Self-pay | Admitting: Family Medicine

## 2020-08-05 DIAGNOSIS — E119 Type 2 diabetes mellitus without complications: Secondary | ICD-10-CM

## 2020-08-12 ENCOUNTER — Ambulatory Visit: Payer: BC Managed Care – PPO | Admitting: Family Medicine

## 2020-08-12 ENCOUNTER — Other Ambulatory Visit: Payer: Self-pay

## 2020-08-12 ENCOUNTER — Encounter: Payer: Self-pay | Admitting: Family Medicine

## 2020-08-12 VITALS — BP 126/81 | HR 82 | Temp 97.0°F | Ht 67.0 in | Wt 268.2 lb

## 2020-08-12 DIAGNOSIS — E1169 Type 2 diabetes mellitus with other specified complication: Secondary | ICD-10-CM

## 2020-08-12 DIAGNOSIS — I10 Essential (primary) hypertension: Secondary | ICD-10-CM | POA: Diagnosis not present

## 2020-08-12 DIAGNOSIS — E785 Hyperlipidemia, unspecified: Secondary | ICD-10-CM | POA: Diagnosis not present

## 2020-08-12 DIAGNOSIS — Z23 Encounter for immunization: Secondary | ICD-10-CM

## 2020-08-12 DIAGNOSIS — E1159 Type 2 diabetes mellitus with other circulatory complications: Secondary | ICD-10-CM | POA: Diagnosis not present

## 2020-08-12 DIAGNOSIS — I152 Hypertension secondary to endocrine disorders: Secondary | ICD-10-CM

## 2020-08-12 DIAGNOSIS — E559 Vitamin D deficiency, unspecified: Secondary | ICD-10-CM | POA: Diagnosis not present

## 2020-08-12 LAB — BAYER DCA HB A1C WAIVED: HB A1C (BAYER DCA - WAIVED): 8.1 % — ABNORMAL HIGH (ref <7.0)

## 2020-08-12 NOTE — Patient Instructions (Signed)

## 2020-08-12 NOTE — Progress Notes (Addendum)
Subjective: CC:f/u DM, HTN, HLD PCP: Janora Norlander, DO PJA:SNKN Debbie Hunt is a 54 y.o. female presenting to clinic today for:  1. Type 2 Diabetes w/ HTN, HLD:  Patient reports compliance with Farxiga 92m daily (added last visit), Metformin 1000 mg twice daily and lisinopril-hydrochlorothiazide 20-12.5 mg daily.   She has been really trying to carb restrict and work on weight.  She is had a successful greater than 20 pound weight loss since her last visit.  She is starting to incorporate exercise now as well.  She has an appointment with the endocrinologist in October to establish care.  So far the FWilder Gladeseems to be doing okay.  She has been trying to maintain vaginal health with a new vaginal wash and this seems to be working well at preventing any yeastlike symptoms.  She reports blood sugars are typically around the 170s.  Denies any visual disturbance, chest pain, shortness of breath, headaches.  No known hypoglycemic episodes  Last eye exam: needs; scheduled in October Last foot exam: UTD Last A1c:  Lab Results  Component Value Date   HGBA1C 10.3 (H) 05/01/2020   Nephropathy screen indicated?: on ACE-I Last flu, zoster and/or pneumovax:  Immunization History  Administered Date(s) Administered  . Influenza Whole 08/23/2010  . Influenza,inj,Quad PF,6+ Mos 09/02/2013, 08/27/2014, 08/25/2015, 08/11/2016, 08/29/2017, 08/20/2018, 08/16/2019  . Influenza-Unspecified 08/21/2012  . Moderna SARS-COVID-2 Vaccination 04/03/2020, 05/08/2020  . Pneumococcal Polysaccharide-23 08/16/2019   ROS: Per HPI  Allergies  Allergen Reactions  . Codeine   . Penicillins Hives   Past Medical History:  Diagnosis Date  . Diabetes mellitus without complication ([place]   . Hyperlipidemia   . Hypertension     Current Outpatient Medications:  .  Accu-Chek Softclix Lancets lancets, Use as instructed to check BGs daily. E11.9, Disp: 100 each, Rfl: 12 .  aspirin EC 81 MG tablet, Take 1 tablet  (81 mg total) by mouth daily., Disp: , Rfl:  .  clotrimazole-betamethasone (LOTRISONE) cream, APPLY TO THE AFFECTED AND SURROUNDING AREAS OF SKIN THREE TIMES DAILY AS NEEDED, Disp: , Rfl:  .  dapagliflozin propanediol (FARXIGA) 10 MG TABS tablet, Take 1 tablet (10 mg total) by mouth daily before breakfast. Coupon: BIN [id number] PCN: [id number], GRP: [id number] ID [id number] Disp: 90 tablet, Rfl: 0 .  ergocalciferol (VITAMIN D2) 1.25 MG (50000 UT) capsule, ergocalciferol (vitamin D2) 1,250 mcg (50,000 unit) capsule, Disp: , Rfl:  .  glucose blood test strip, Check BGs once daily E11.9 (has accucheck machine), Disp: 100 each, Rfl: 12 .  hydrocortisone (ANUSOL-HC) 25 MG suppository, Place 1 suppository (25 mg total) rectally 2 (two) times daily as needed for hemorrhoids., Disp: 12 suppository, Rfl: 2 .  lisinopril-hydrochlorothiazide (ZESTORETIC) 20-12.5 MG tablet, Take 1 tablet by mouth daily., Disp: 90 tablet, Rfl: 1 .  metFORMIN (GLUCOPHAGE) 1000 MG tablet, TAKE 1 TABLET (1,000 MG TOTAL) BY MOUTH 2 (TWO) TIMES DAILY WITH A MEAL., Disp: 180 tablet, Rfl: 0 Social History   Socioeconomic History  . Marital status: Married    Spouse name: Not on file  . Number of children: Not on file  . Years of education: Not on file  . Highest education level: Not on file  Occupational History  . Not on file  Tobacco Use  . Smoking status: Never Smoker  . Smokeless tobacco: Never Used  Vaping Use  . Vaping Use: Never used  Substance and Sexual Activity  . Alcohol use: No  . Drug use: No  . Sexual activity:  Yes  Other Topics Concern  . Not on file  Social History Narrative  . Not on file   Social Determinants of Health   Financial Resource Strain:   . Difficulty of Paying Living Expenses: Not on file  Food Insecurity:   . Worried About Charity fundraiser in the Last Year: Not on file  . Ran Out of Food in the Last Year: Not on file  Transportation Needs:   . Lack of Transportation (Medical): Not on  file  . Lack of Transportation (Non-Medical): Not on file  Physical Activity:   . Days of Exercise per Week: Not on file  . Minutes of Exercise per Session: Not on file  Stress:   . Feeling of Stress : Not on file  Social Connections:   . Frequency of Communication with Friends and Family: Not on file  . Frequency of Social Gatherings with Friends and Family: Not on file  . Attends Religious Services: Not on file  . Active Member of Clubs or Organizations: Not on file  . Attends Archivist Meetings: Not on file  . Marital Status: Not on file  Intimate Partner Violence:   . Fear of Current or Ex-Partner: Not on file  . Emotionally Abused: Not on file  . Physically Abused: Not on file  . Sexually Abused: Not on file   Family History  Problem Relation Age of Onset  . Diabetes Mother   . Hypertension Mother     Objective: Office vital signs reviewed. BP 126/81   Pulse 82   Temp (!) 97 F (36.1 C)   Ht '5\' 7"'  (1.702 m)   Wt 268 lb 3.2 oz (121.7 kg)   SpO2 100%   BMI 42.01 kg/m   Physical Examination:  General: Awake, alert, well nourished, obese. No acute distress HEENT: Normal; sclera white.  Moist mucous membranes. Cardio: regular rate and rhythm, S1S2 heard, no murmurs appreciated Pulm: clear to auscultation bilaterally, no wheezes, rhonchi or rales; normal work of breathing on room air Extremities: warm, well perfused, No edema, cyanosis or clubbing; +2 pulses bilaterally MSK: normal gait and station  Lab Results  Component Value Date   HGBA1C 8.1 (H) 08/12/2020    Assessment/ Plan: 54 y.o. female    1. Type 2 diabetes mellitus with other specified complication, without long-term current use of insulin (HCC) Not at goal but substantially improved from previous check.  Her A1c is now down from 10.3 to 8.1.  She seems to be tolerating her medication regimen without difficulty now.  She is really trying to work on weight loss and would like to avoid extra  medications at this point.  She would like to defer this to her checkup with endocrinology in October and I think that this is reasonable given her progress thus far.  She is to continue monitoring her blood sugars very closely and bring that log to the endocrinologist visit.  May benefit from the addition of something mild like Januvia but will await their assessment. - Bayer DCA Hb A1c Waived  2. Hypertension associated with diabetes ([place]) Controlled.  Continue current regimen - CMP14+EGFR  3. Hyperlipidemia associated with type 2 diabetes mellitus ([place]) Would benefit from statin.  I am going to recheck her lipid panel today - CMP14+EGFR - Lipid Panel - TSH  4. Vitamin D deficiency Check with today's labs - VITAMIN D 25 Hydroxy (Vit-D Deficiency, Fractures)  5. Need for Tdap vaccination Administered - Tdap vaccine greater than or  equal to 7yo IM  6. Flu vaccine need Administered - Flu Vaccine QUAD 6+ mos PF IM (Fluarix Quad PF)   Orders Placed This Encounter  Procedures  . Flu Vaccine QUAD 6+ mos PF IM (Fluarix Quad PF)  . Tdap vaccine greater than or equal to 7yo IM  . Bayer DCA Hb A1c Waived  . CMP14+EGFR  . Lipid Panel  . TSH  . VITAMIN D 25 Hydroxy (Vit-D Deficiency, Fractures)   No orders of the defined types were placed in this encounter.   Janora Norlander, DO [place] [phone]

## 2020-08-13 LAB — CMP14+EGFR
ALT: 16 IU/L (ref 0–32)
AST: 18 IU/L (ref 0–40)
Albumin/Globulin Ratio: 1.9 (ref 1.2–2.2)
Albumin: 4.5 g/dL (ref 3.8–4.9)
Alkaline Phosphatase: 75 IU/L (ref 44–121)
BUN/Creatinine Ratio: 21 (ref 9–23)
BUN: 16 mg/dL (ref 6–24)
Bilirubin Total: 0.7 mg/dL (ref 0.0–1.2)
CO2: 22 mmol/L (ref 20–29)
Calcium: 9.5 mg/dL (ref 8.7–10.2)
Chloride: 99 mmol/L (ref 96–106)
Creatinine, Ser: 0.76 mg/dL (ref 0.57–1.00)
GFR calc Af Amer: 104 mL/min/{1.73_m2} (ref >59)
GFR calc non Af Amer: 90 mL/min/{1.73_m2} (ref >59)
Globulin, Total: 2.4 g/dL (ref 1.5–4.5)
Glucose: 161 mg/dL — ABNORMAL HIGH (ref 65–99)
Potassium: 4 mmol/L (ref 3.5–5.2)
Sodium: 139 mmol/L (ref 134–144)
Total Protein: 6.9 g/dL (ref 6.0–8.5)

## 2020-08-13 LAB — LIPID PANEL
Chol/HDL Ratio: 3.1 ratio (ref 0.0–4.4)
Cholesterol, Total: 147 mg/dL (ref 100–199)
HDL: 47 mg/dL (ref >39)
LDL Chol Calc (NIH): 82 mg/dL (ref 0–99)
Triglycerides: 98 mg/dL (ref 0–149)
VLDL Cholesterol Cal: 18 mg/dL (ref 5–40)

## 2020-08-13 LAB — TSH: TSH: 1.77 u[IU]/mL (ref 0.450–4.500)

## 2020-08-13 LAB — VITAMIN D 25 HYDROXY (VIT D DEFICIENCY, FRACTURES): Vit D, 25-Hydroxy: 31.8 ng/mL (ref 30.0–100.0)

## 2020-08-25 DIAGNOSIS — Z01419 Encounter for gynecological examination (general) (routine) without abnormal findings: Secondary | ICD-10-CM | POA: Diagnosis not present

## 2020-08-25 DIAGNOSIS — Z124 Encounter for screening for malignant neoplasm of cervix: Secondary | ICD-10-CM | POA: Diagnosis not present

## 2020-08-25 DIAGNOSIS — Z1231 Encounter for screening mammogram for malignant neoplasm of breast: Secondary | ICD-10-CM | POA: Diagnosis not present

## 2020-08-25 DIAGNOSIS — Z6839 Body mass index (BMI) 39.0-39.9, adult: Secondary | ICD-10-CM | POA: Diagnosis not present

## 2020-09-10 ENCOUNTER — Encounter: Payer: Self-pay | Admitting: Internal Medicine

## 2020-09-10 ENCOUNTER — Ambulatory Visit: Payer: BC Managed Care – PPO | Admitting: Internal Medicine

## 2020-09-10 ENCOUNTER — Other Ambulatory Visit: Payer: Self-pay

## 2020-09-10 VITALS — BP 126/82 | HR 80 | Temp 97.2°F | Ht 67.0 in | Wt 261.0 lb

## 2020-09-10 DIAGNOSIS — E1169 Type 2 diabetes mellitus with other specified complication: Secondary | ICD-10-CM | POA: Diagnosis not present

## 2020-09-10 DIAGNOSIS — E1165 Type 2 diabetes mellitus with hyperglycemia: Secondary | ICD-10-CM

## 2020-09-10 LAB — GLUCOSE, POCT (MANUAL RESULT ENTRY): POC Glucose: 158 mg/dl — AB (ref 70–99)

## 2020-09-10 MED ORDER — RYBELSUS 3 MG PO TABS: 3.0000 mg | ORAL_TABLET | Freq: Every day | ORAL | 6 refills | Status: DC

## 2020-09-10 MED ORDER — GLIPIZIDE 5 MG PO TABS: 5.0000 mg | ORAL_TABLET | Freq: Every day | ORAL | 1 refills | Status: DC

## 2020-09-10 NOTE — Progress Notes (Signed)
Name: Debbie Hunt  MRN/ DOB: [id number], 1966-03-31   Age/ Sex: 54 y.o., female    PCP: Debbie Ip, DO   Reason for Endocrinology Evaluation: Type 2 Diabetes Mellitus     Date of Initial Endocrinology Visit: 09/12/2020     PATIENT IDENTIFIER: Debbie Hunt is a 54 y.o. female with a past medical history of T2DM and dyslipidemia. The patient presented for initial endocrinology clinic visit on 09/12/2020 for consultative assistance with her diabetes management.    HPI: Debbie Hunt was    Diagnosed with DM She had gestations diabetes with her daughter ~26 yrs ago, She was diagnosed with T2DM in 2007 Prior Medications tried/Intolerance:Glipizide- no side effects  Currently checking blood sugars 1 x / day Hypoglycemia episodes : no        Hemoglobin A1c has ranged from 7.0% in 2019, peaking at 10.3% in 2021. Patient required assistance for hypoglycemia: no Patient has required hospitalization within the last 1 year from hyper or hypoglycemia: no  In terms of diet, the patient eats 2 meals a day, does not snack. Avoids sugar sweetened beverages.   Has recurrent yeast infections since June  , She is on her 3rd diflucan treatment. She is also on monistat    Father with hx of thyroid disease    HOME DIABETES REGIMEN: Farxiga 10 mg daily  Metformin 1000 mg BID   Statin: no ACE-I/ARB: yes Prior Diabetic Education:Years ago    METER DOWNLOAD SUMMARY: Did not bring  DIABETIC COMPLICATIONS: Microvascular complications:    Denies: CKD, neuropathy, retinopathy  Last eye exam: Completed 11/2018  Macrovascular complications:   Denies: CAD, PVD, CVA   PAST HISTORY: Past Medical History:  Past Medical History:  Diagnosis Date  . Diabetes mellitus without complication (HCC)   . Hyperlipidemia   . Hypertension    Past Surgical History:  Past Surgical History:  Procedure Laterality Date  . albaltion    . CHOLECYSTECTOMY        Social History:   reports that she has never smoked. She has never used smokeless tobacco. She reports that she does not drink alcohol and does not use drugs. Family History:  Family History  Problem Relation Age of Onset  . Diabetes Mother   . Hypertension Mother      HOME MEDICATIONS: Allergies as of 09/10/2020      Reactions   Codeine    Penicillins Hives      Medication List       Accurate as of September 10, 2020 11:59 PM. If you have any questions, ask your nurse or doctor.        STOP taking these medications   dapagliflozin propanediol 10 MG Tabs tablet Commonly known as: [place] Stopped by: Scarlette Shorts, MD   ergocalciferol 1.25 MG (50000 UT) capsule Commonly known as: VITAMIN D2 Stopped by: Scarlette Shorts, MD     TAKE these medications   Accu-Chek Softclix Lancets lancets Use as instructed to check BGs daily. E11.9   aspirin EC 81 MG tablet Take 1 tablet (81 mg total) by mouth daily.   clotrimazole-betamethasone cream Commonly known as: LOTRISONE APPLY TO THE AFFECTED AND SURROUNDING AREAS OF SKIN THREE TIMES DAILY AS NEEDED   glipiZIDE 5 MG tablet Commonly known as: GLUCOTROL Take 1 tablet (5 mg total) by mouth daily before breakfast. Started by: Scarlette Shorts, MD   glucose blood test strip Check BGs once daily E11.9 (has accucheck machine)   hydrocortisone 25  MG suppository Commonly known as: ANUSOL-HC Place 1 suppository (25 mg total) rectally 2 (two) times daily as needed for hemorrhoids.   lisinopril-hydrochlorothiazide 20-12.5 MG tablet Commonly known as: ZESTORETIC Take 1 tablet by mouth daily.   metFORMIN 1000 MG tablet Commonly known as: GLUCOPHAGE TAKE 1 TABLET (1,000 MG TOTAL) BY MOUTH 2 (TWO) TIMES DAILY WITH A MEAL.   Rybelsus 3 MG Tabs Generic drug: Semaglutide Take 3 mg by mouth daily before breakfast. Started by: Scarlette Shorts, MD        ALLERGIES: Allergies  Allergen Reactions  . Codeine   . Penicillins  Hives     REVIEW OF SYSTEMS: A comprehensive ROS was conducted with the patient and is negative except as per HPI    OBJECTIVE:   VITAL SIGNS: BP 126/82   Pulse 80   Temp (!) 97.2 F (36.2 C) (Temporal)   Ht 5\' 7"  (1.702 m)   Wt 261 lb (118.4 kg)   SpO2 99%   BMI 40.88 kg/m    PHYSICAL EXAM:  General: Pt appears well and is in NAD  Neck: General: Supple without adenopathy or carotid bruits. Thyroid: Thyroid size normal.  No goiter or nodules appreciated  Lungs: Clear with good BS bilat with no rales, rhonchi, or wheezes  Heart: RRR with normal S1 and S2 and no gallops; no murmurs; no rub  Abdomen: Normoactive bowel sounds, soft, nontender, without masses or organomegaly palpable  Extremities:  Lower extremities - No pretibial edema. No lesions.  Skin: Normal texture and temperature to palpation.  Neuro: MS is good with appropriate affect, pt is alert and Ox3      DATA REVIEWED:  Lab Results  Component Value Date   HGBA1C 8.1 (H) 08/12/2020   HGBA1C 10.3 (H) 05/01/2020   HGBA1C 8.1 (H) 08/16/2019   Lab Results  Component Value Date   LDLCALC 82 08/12/2020   CREATININE 0.76 08/12/2020   Lab Results  Component Value Date   MICRALBCREAT <28.0 05/03/2018    Lab Results  Component Value Date   CHOL 147 08/12/2020   HDL 47 08/12/2020   LDLCALC 82 08/12/2020   TRIG 98 08/12/2020   CHOLHDL 3.1 08/12/2020        ASSESSMENT / PLAN / RECOMMENDATIONS:   1) Type 2 Diabetes Mellitus, Poorly controlled, Without complications - Most recent A1c of 8.1 %. Goal A1c <7.0 %.    Plan: GENERAL: I have discussed with the patient the pathophysiology of diabetes. We went over the natural progression of the disease. We talked about both insulin resistance and insulin deficiency. We stressed the importance of lifestyle changes including diet and exercise. I explained the complications associated with diabetes including retinopathy, nephropathy, neuropathy as well as increased  risk of cardiovascular disease. We went over the benefit seen with glycemic control.    I explained to the patient that diabetic patients are at higher than normal risk for amputations. The patient was informed that diabetes is the number one cause of non-traumatic amputations in 08/14/2020.   She is having recurrent yeast infections with [place], which we will stop . We dicussed other classes such as SU and GLP-1 agonists. We discussed risks and benefits of each of these classes.   We emphasized the importance of exercise and low carb diet as well avoiding sugar-sweetened beverages   We have agreed on the following   MEDICATIONS: - STOP Farxiga - Continue Metformin 1000 mg Twice a day  - Start Glipizide 5 mg , 1  tablet before Breakfast  - Start Rybelsus 3 mg, 1 tablet before Breakfast   EDUCATION / INSTRUCTIONS:  BG monitoring instructions: Patient is instructed to check her blood sugars 2 times a day, fasting and bedtime.  Call [facility] if: BG persistently < 70  . I reviewed the Rule of 15 for the treatment of hypoglycemia in detail with the patient. Literature supplied.   2) Diabetic complications:   Eye: Does not have known diabetic retinopathy.   Neuro/ Feet: Does not have known diabetic peripheral neuropathy.  Renal: Patient does not have known baseline CKD. She is  on an ACEI/ARB at present  3) Lipids: Patient is not on a statin. LDL at 82 mg/dL     I spent 45 minutes preparing to see the patient by review of recent labs, imaging and procedures, obtaining and reviewing separately obtained history, communicating with the patient/family or caregiver, ordering medications, tests or procedures, and documenting clinical information in the EHR including the differential Dx, treatment, and any further evaluation and other management           Signed electronically by: Lyndle Herrlich, MD  [facility]  [facility] [address] [place] [place], [place] [address] Phone: [phone] FAX: [phone]   CC: Debbie Ip, DO [address] [place] [address] Phone: [phone]  Fax: [phone]    Return to Endocrinology clinic as below: Future Appointments  Date Time Provider Department Center  12/16/2020  7:30 AM Damaria Stofko, Konrad Dolores, MD [facility] None  02/09/2021  8:00 AM Debbie Ip, DO [facility] None

## 2020-09-10 NOTE — Patient Instructions (Addendum)
-   STOP Farxiga - Continue Metformin 1000 mg Twice a day  - Start Glipizide 5 mg , 1 tablet before Breakfast  - Start Rybelsus 3 mg, 1 tablet before Breakfast     Brisk walk between 150-175 minutes per week     HOW TO TREAT LOW BLOOD SUGARS (Blood sugar LESS THAN 70 MG/DL)  Please follow the RULE OF 15 for the treatment of hypoglycemia treatment (when your (blood sugars are less than 70 mg/dL)    STEP 1: Take 15 grams of carbohydrates when your blood sugar is low, which includes:   3-4 GLUCOSE TABS  OR  3-4 OZ OF JUICE OR REGULAR SODA OR  ONE TUBE OF GLUCOSE GEL     STEP 2: RECHECK blood sugar in 15 MINUTES STEP 3: If your blood sugar is still low at the 15 minute recheck --> then, go back to STEP 1 and treat AGAIN with another 15 grams of carbohydrates.

## 2020-09-12 DIAGNOSIS — E1165 Type 2 diabetes mellitus with hyperglycemia: Secondary | ICD-10-CM | POA: Insufficient documentation

## 2020-10-01 ENCOUNTER — Encounter: Payer: Self-pay | Admitting: Internal Medicine

## 2020-10-01 MED ORDER — RYBELSUS 7 MG PO TABS: 7.0000 mg | ORAL_TABLET | Freq: Every day | ORAL | 3 refills | Status: DC

## 2020-10-10 ENCOUNTER — Other Ambulatory Visit: Payer: Self-pay | Admitting: Family Medicine

## 2020-10-10 DIAGNOSIS — I10 Essential (primary) hypertension: Secondary | ICD-10-CM

## 2020-11-06 DIAGNOSIS — Z23 Encounter for immunization: Secondary | ICD-10-CM | POA: Diagnosis not present

## 2020-12-14 ENCOUNTER — Other Ambulatory Visit: Payer: Self-pay

## 2020-12-16 ENCOUNTER — Encounter: Payer: Self-pay | Admitting: Internal Medicine

## 2020-12-16 ENCOUNTER — Encounter: Payer: Self-pay | Admitting: Family Medicine

## 2020-12-16 ENCOUNTER — Other Ambulatory Visit: Payer: Self-pay

## 2020-12-16 ENCOUNTER — Ambulatory Visit: Payer: BC Managed Care – PPO | Admitting: Internal Medicine

## 2020-12-16 VITALS — BP 130/82 | HR 80 | Ht 67.0 in | Wt 256.4 lb

## 2020-12-16 DIAGNOSIS — E119 Type 2 diabetes mellitus without complications: Secondary | ICD-10-CM | POA: Diagnosis not present

## 2020-12-16 LAB — POCT GLYCOSYLATED HEMOGLOBIN (HGB A1C): Hemoglobin A1C: 6 % — AB (ref 4.0–5.6)

## 2020-12-16 MED ORDER — DEXCOM G6 TRANSMITTER MISC: 1.0000 | 3 refills | Status: DC

## 2020-12-16 MED ORDER — DEXCOM G6 SENSOR MISC: 1.0000 | 3 refills | Status: DC

## 2020-12-16 NOTE — Progress Notes (Addendum)
Name: Debbie Hunt  Age/ Sex: 55 y.o., female   MRN/ DOB: [id number], 1966/05/05     PCP: Raliegh Ip, DO   Reason for Endocrinology Evaluation: Type 2 Diabetes Mellitus  Initial Endocrine Consultative Visit: 09/10/2020    PATIENT IDENTIFIER: Debbie Hunt is a 55 y.o. female with a past medical history of T2DM and Dyslipidemia . The patient has followed with Endocrinology clinic since 09/10/2020 for consultative assistance with management of her diabetes.  DIABETIC HISTORY:  Debbie Hunt was diagnosed with gestational diabetes in ~1995 then was diagnosed with T2DM in 2007. Her hemoglobin A1c has ranged from 7.0% in 2019, peaking at 10.3% in 2021.  On her initial visit to our clinic she had an A1c of 8.1 %. She was on Farxiga and Metformin, we stopped [name] due to recurrent yeast infection hx of about 6 months . Continue Metformin , started Glipizide and Rybelsus   but Rybelsus was cost prohibitive    Father with hx of thyroid disease   SUBJECTIVE:   During the last visit (09/12/2020): A1c 8.1 % Stopped Farxiga and continued Metformin and started  Glipizide as well as Rybelsus      Today (12/16/2020): Debbie Hunt is here for a follow up on diabetes management.  She checks her blood sugars 1 times daily. The patient has had hypoglycemic episodes since the last clinic visit, which typically occur 2 x / month- most often occuring during the day . The patient is symptomatic with these episodes.   Denies nausea or diarrhea , mainly constipation     HOME DIABETES REGIMEN:  Metformin 1000 mg Twice a day  Glipizide 5 mg , 1 tablet daily  Rybelsus 7 mg daily     Statin: no ACE-I/ARB: yes    METER DOWNLOAD SUMMARY: Date range evaluated: 1/12-1/26/2022  Average Number Tests/Day = 0.8 Overall Mean FS Glucose = 124 Standard Deviation = 40  BG Ranges: Low = 59 High = 182   Hypoglycemic Events/30 Days: BG < 50 = 0 Episodes of symptomatic severe  hypoglycemia = 0    DIABETIC COMPLICATIONS: Microvascular complications:    Denies: CKD, neuropathy, retinopathy  Last Eye Exam: Completed 11/2018  Macrovascular complications:    Denies: CAD, CVA, PVD   HISTORY:  Past Medical History:  Past Medical History:  Diagnosis Date  . Diabetes mellitus without complication (HCC)   . Hyperlipidemia   . Hypertension    Past Surgical History:  Past Surgical History:  Procedure Laterality Date  . albaltion    . CHOLECYSTECTOMY      Social History:  reports that she has never smoked. She has never used smokeless tobacco. She reports that she does not drink alcohol and does not use drugs. Family History:  Family History  Problem Relation Age of Onset  . Diabetes Mother   . Hypertension Mother      HOME MEDICATIONS: Allergies as of 12/16/2020      Reactions   Codeine    Penicillins Hives      Medication List       Accurate as of December 16, 2020  7:44 AM. If you have any questions, ask your nurse or doctor.        Accu-Chek Softclix Lancets lancets Use as instructed to check BGs daily. E11.9   aspirin EC 81 MG tablet Take 1 tablet (81 mg total) by mouth daily.   clotrimazole-betamethasone cream Commonly known as: LOTRISONE APPLY TO THE AFFECTED AND SURROUNDING AREAS OF SKIN THREE  TIMES DAILY AS NEEDED   glipiZIDE 5 MG tablet Commonly known as: GLUCOTROL Take 1 tablet (5 mg total) by mouth daily before breakfast.   glucose blood test strip Check BGs once daily E11.9 (has accucheck machine)   hydrocortisone 25 MG suppository Commonly known as: ANUSOL-HC Place 1 suppository (25 mg total) rectally 2 (two) times daily as needed for hemorrhoids.   lisinopril-hydrochlorothiazide 20-12.5 MG tablet Commonly known as: ZESTORETIC TAKE 1 TABLET DAILY   metFORMIN 1000 MG tablet Commonly known as: GLUCOPHAGE TAKE 1 TABLET (1,000 MG TOTAL) BY MOUTH 2 (TWO) TIMES DAILY WITH A MEAL.   Rybelsus 7 MG Tabs Generic  drug: Semaglutide Take 7 mg by mouth daily before breakfast.        OBJECTIVE:   Vital Signs: BP 130/82   Pulse 80   Ht 5\' 7"  (1.702 m)   Wt 256 lb 6 oz (116.3 kg)   LMP 09/26/2014   SpO2 97%   BMI 40.15 kg/m   Wt Readings from Last 3 Encounters:  12/16/20 256 lb 6 oz (116.3 kg)  09/10/20 261 lb (118.4 kg)  08/12/20 268 lb 3.2 oz (121.7 kg)     Exam: General: Pt appears well and is in NAD  Neck: General: Supple without adenopathy. Thyroid: Thyroid size normal.  No goiter or nodules appreciated.  Lungs: Clear with good BS bilat with no rales, rhonchi, or wheezes  Heart: RRR   Abdomen: Normoactive bowel sounds, soft, nontender, without masses or organomegaly palpable  Extremities: No pretibial edema.   Neuro: MS is good with appropriate affect, pt is alert and Ox3    DM foot exam: 12/16/2020    The skin of the feet is intact without sores or ulcerations. The pedal pulses are 2+ on right and 2+ on left. The sensation is intact to a screening 5.07, 10 gram monofilament bilaterally     DATA REVIEWED:  Lab Results  Component Value Date   HGBA1C 6.0 (A) 12/16/2020   HGBA1C 8.1 (H) 08/12/2020   HGBA1C 10.3 (H) 05/01/2020   Lab Results  Component Value Date   LDLCALC 82 08/12/2020   CREATININE 0.76 08/12/2020   Lab Results  Component Value Date   MICRALBCREAT <28.0 05/03/2018     Lab Results  Component Value Date   CHOL 147 08/12/2020   HDL 47 08/12/2020   LDLCALC 82 08/12/2020   TRIG 98 08/12/2020   CHOLHDL 3.1 08/12/2020         ASSESSMENT / PLAN / RECOMMENDATIONS:   1) Type 2 Diabetes Mellitus, Optimally controlled, Without complications - Most recent A1c of 6.0 %. Goal A1c < 7.0 %.      - I have congratulated her on the improved glycemic control., due to hypoglycemia , will stop Glipizide  - She was provided with a coupon for Rybelsus  - Intolerant to 08/14/2020 due to recurrent genital infections - Dexcom sent   MEDICATIONS: - STOP  Glipizide  - Continue Metformin 1000 mg Twice a day  - Continue Rybelsus 7 mg, 1 tablet before Breakfast   EDUCATION / INSTRUCTIONS:  BG monitoring instructions: Patient is instructed to check her blood sugars 1 times a day, fasting.  Call [facility] if: BG persistently < 70 . I reviewed the Rule of 15 for the treatment of hypoglycemia in detail with the patient. Literature supplied.     2) Diabetic complications:   Eye: Does not have known diabetic retinopathy.   Neuro/ Feet: Does not have known diabetic peripheral  neuropathy .   Renal: Patient does not have known baseline CKD. She   is  on an ACEI/ARB at present.    3) Lipids: Patient is not on a statin, LDL at 82 mg/dL.     F/U in 3 months     Signed electronically by: Lyndle Herrlich, MD  [facility]  [facility] [address] [place], [place] [address] Phone: [phone] FAX: [phone]   CC: Raliegh Ip, DO [address] [place] [address] Phone: [phone]  Fax: [phone]  Return to Endocrinology clinic as below: Future Appointments  Date Time Provider Department Center  02/09/2021  8:00 AM Raliegh Ip, DO [facility] None

## 2020-12-16 NOTE — Addendum Note (Signed)
Addended by: Scarlette Shorts on: 12/16/2020 11:53 AM   Modules accepted: Orders

## 2020-12-16 NOTE — Patient Instructions (Addendum)
-   Keep up the Good Work ! - STOP Glipizide  - Continue Metformin 1000 mg Twice a day  - Continue Rybelsus 7 mg, 1 tablet before Breakfast       HOW TO TREAT LOW BLOOD SUGARS (Blood sugar LESS THAN 70 MG/DL)  Please follow the RULE OF 15 for the treatment of hypoglycemia treatment (when your (blood sugars are less than 70 mg/dL)    STEP 1: Take 15 grams of carbohydrates when your blood sugar is low, which includes:   3-4 GLUCOSE TABS  OR  3-4 OZ OF JUICE OR REGULAR SODA OR  ONE TUBE OF GLUCOSE GEL     STEP 2: RECHECK blood sugar in 15 MINUTES STEP 3: If your blood sugar is still low at the 15 minute recheck --> then, go back to STEP 1 and treat AGAIN with another 15 grams of carbohydrates.

## 2021-02-09 ENCOUNTER — Ambulatory Visit: Payer: BC Managed Care – PPO | Admitting: Family Medicine

## 2021-02-09 ENCOUNTER — Encounter: Payer: Self-pay | Admitting: Family Medicine

## 2021-02-09 ENCOUNTER — Other Ambulatory Visit: Payer: Self-pay

## 2021-02-09 VITALS — BP 106/73 | HR 81 | Temp 98.0°F | Resp 20 | Ht 67.0 in | Wt 253.0 lb

## 2021-02-09 DIAGNOSIS — H6981 Other specified disorders of Eustachian tube, right ear: Secondary | ICD-10-CM

## 2021-02-09 DIAGNOSIS — E1159 Type 2 diabetes mellitus with other circulatory complications: Secondary | ICD-10-CM | POA: Diagnosis not present

## 2021-02-09 DIAGNOSIS — I10 Essential (primary) hypertension: Secondary | ICD-10-CM | POA: Diagnosis not present

## 2021-02-09 DIAGNOSIS — E1169 Type 2 diabetes mellitus with other specified complication: Secondary | ICD-10-CM

## 2021-02-09 DIAGNOSIS — I152 Hypertension secondary to endocrine disorders: Secondary | ICD-10-CM

## 2021-02-09 DIAGNOSIS — E785 Hyperlipidemia, unspecified: Secondary | ICD-10-CM

## 2021-02-09 MED ORDER — LISINOPRIL-HYDROCHLOROTHIAZIDE 20-12.5 MG PO TABS: 1.0000 | ORAL_TABLET | Freq: Every day | ORAL | 3 refills | Status: DC

## 2021-02-09 NOTE — Progress Notes (Signed)
Subjective: CC: Controlled DM PCP: Raliegh Ip, DO YFV:CBSW KRIS BURD is a 55 y.o. female presenting to clinic today for:  1. Type 2 Diabetes with hypertension, hyperlipidemia:  Seeing endocrinology with last visit in January.  She is off of glipizide due to hypoglycemia and on Rybelsus 7 mg along with Metformin 1000 mg twice daily.  She is tolerating this without difficulty.  No abdominal issues.  She is compliant with her Zestoretic.  Blood pressure little in the borderline today but notes that she is not had any problems with dizziness, lightheadedness.  Last eye exam: needs, scheduled  Last foot exam: UTD Last A1c:  Lab Results  Component Value Date   HGBA1C 6.0 (A) 12/16/2020   Nephropathy screen indicated?: UTD Last flu, zoster and/or pneumovax:  Immunization History  Administered Date(s) Administered  . Influenza Whole 08/23/2010  . Influenza,inj,Quad PF,6+ Mos 09/02/2013, 08/27/2014, 08/25/2015, 08/11/2016, 08/29/2017, 08/20/2018, 08/16/2019, 08/12/2020  . Influenza-Unspecified 08/21/2012  . Moderna Sars-Covid-2 Vaccination 04/03/2020, 05/08/2020  . Pneumococcal Polysaccharide-23 08/16/2019  . Tdap 08/12/2020    2.  Right ear pain Patient reports couple day history of right intermittent ear pain.  Sometimes it will wake her at night.  She feels that "feels waxy".  She feels like there is fluid in there but has not appreciated need for drainage.  Again denies any dizziness.   ROS: Per HPI  Allergies  Allergen Reactions  . Codeine   . Penicillins Hives   Past Medical History:  Diagnosis Date  . Diabetes mellitus without complication (HCC)   . Hyperlipidemia   . Hypertension     Current Outpatient Medications:  .  Accu-Chek Softclix Lancets lancets, Use as instructed to check BGs daily. E11.9, Disp: 100 each, Rfl: 12 .  aspirin EC 81 MG tablet, Take 1 tablet (81 mg total) by mouth daily., Disp: , Rfl:  .  clotrimazole-betamethasone (LOTRISONE) cream,  APPLY TO THE AFFECTED AND SURROUNDING AREAS OF SKIN THREE TIMES DAILY AS NEEDED, Disp: , Rfl:  .  Continuous Blood Gluc Sensor (DEXCOM G6 SENSOR) MISC, 1 Device by Does not apply route as directed., Disp: 9 each, Rfl: 3 .  Continuous Blood Gluc Transmit (DEXCOM G6 TRANSMITTER) MISC, 1 Device by Does not apply route as directed., Disp: 1 each, Rfl: 3 .  glucose blood test strip, Check BGs once daily E11.9 (has accucheck machine), Disp: 100 each, Rfl: 12 .  hydrocortisone (ANUSOL-HC) 25 MG suppository, Place 1 suppository (25 mg total) rectally 2 (two) times daily as needed for hemorrhoids., Disp: 12 suppository, Rfl: 2 .  lisinopril-hydrochlorothiazide (ZESTORETIC) 20-12.5 MG tablet, TAKE 1 TABLET DAILY, Disp: 90 tablet, Rfl: 1 .  metFORMIN (GLUCOPHAGE) 1000 MG tablet, TAKE 1 TABLET (1,000 MG TOTAL) BY MOUTH 2 (TWO) TIMES DAILY WITH A MEAL., Disp: 180 tablet, Rfl: 0 .  Semaglutide (RYBELSUS) 7 MG TABS, Take 7 mg by mouth daily before breakfast., Disp: 90 tablet, Rfl: 3 Social History   Socioeconomic History  . Marital status: Married    Spouse name: Not on file  . Number of children: Not on file  . Years of education: Not on file  . Highest education level: Not on file  Occupational History  . Not on file  Tobacco Use  . Smoking status: Never Smoker  . Smokeless tobacco: Never Used  Vaping Use  . Vaping Use: Never used  Substance and Sexual Activity  . Alcohol use: No  . Drug use: No  . Sexual activity: Yes  Other Topics  Concern  . Not on file  Social History Narrative  . Not on file   Social Determinants of Health   Financial Resource Strain: Not on file  Food Insecurity: Not on file  Transportation Needs: Not on file  Physical Activity: Not on file  Stress: Not on file  Social Connections: Not on file  Intimate Partner Violence: Not on file   Family History  Problem Relation Age of Onset  . Diabetes Mother   . Hypertension Mother     Objective: Office vital signs  reviewed. BP 106/73   Pulse 81   Temp 98 F (36.7 C)   Resp 20   Ht 5\' 7"  (1.702 m)   Wt 253 lb (114.8 kg)   LMP 09/26/2014   SpO2 100%   BMI 39.63 kg/m   Physical Examination:  General: Awake, alert, well nourished, No acute distress HEENT: Normal; sclera white.  Right TM with mild clear effusion.  No bulging.  Both TMs are dull Cardio: regular rate and rhythm, S1S2 heard, no murmurs appreciated Pulm: clear to auscultation bilaterally, no wheezes, rhonchi or rales; normal work of breathing on room air Extremities: warm, well perfused, No edema, cyanosis or clubbing; +2 pulses bilaterally  Assessment/ Plan: 55 y.o. female   Dysfunction of right eustachian tube  Type 2 diabetes mellitus with other specified complication, without long-term current use of insulin (HCC) - Plan: CANCELED: Bayer DCA Hb A1c Waived  Hypertension associated with diabetes (HCC)  Hyperlipidemia associated with type 2 diabetes mellitus (HCC)  Essential hypertension - Plan: lisinopril-hydrochlorothiazide (ZESTORETIC) 20-12.5 MG tablet  Clear effusion noted behind the right TM.  No evidence of secondary infection.  Home care instructions reviewed.  Start oral antihistamine  Diabetes under excellent control.  Continue current regimen with endocrinology.  Blood pressure well controlled.  Somewhat borderline low she is asymptomatic.  No changes to medication regimen  Plan for fasting cholesterol, labs at next visit.  Not currently on statin but may benefit from pulse dosing with 5 mg of Crestor due to diabetes    No orders of the defined types were placed in this encounter.  No orders of the defined types were placed in this encounter.    41, DO [facility] [phone]

## 2021-02-09 NOTE — Patient Instructions (Signed)
Your ear did not show infection but there is a little fluid behind the ear drum.  Claritin or Xyzal would be helpful to "dry it up".  Eustachian Tube Dysfunction  Eustachian tube dysfunction refers to a condition in which a blockage develops in the narrow passage that connects the middle ear to the back of the nose (eustachian tube). The eustachian tube regulates air pressure in the middle ear by letting air move between the ear and nose. It also helps to drain fluid from the middle ear space. Eustachian tube dysfunction can affect one or both ears. When the eustachian tube does not function properly, air pressure, fluid, or both can build up in the middle ear. What are the causes? This condition occurs when the eustachian tube becomes blocked or cannot open normally. Common causes of this condition include:  Ear infections.  Colds and other infections that affect the nose, mouth, and throat (upper respiratory tract).  Allergies.  Irritation from cigarette smoke.  Irritation from stomach acid coming up into the esophagus (gastroesophageal reflux). The esophagus is the tube that carries food from the mouth to the stomach.  Sudden changes in air pressure, such as from descending in an airplane or scuba diving.  Abnormal growths in the nose or throat, such as: ? Growths that line the nose (nasal polyps). ? Abnormal growth of cells (tumors). ? Enlarged tissue at the back of the throat (adenoids). What increases the risk? You are more likely to develop this condition if:  You smoke.  You are overweight.  You are a child who has: ? Certain birth defects of the mouth, such as cleft palate. ? Large tonsils or adenoids. What are the signs or symptoms? Common symptoms of this condition include:  A feeling of fullness in the ear.  Ear pain.  Clicking or popping noises in the ear.  Ringing in the ear.  Hearing loss.  Loss of balance.  Dizziness. Symptoms may get worse when the  air pressure around you changes, such as when you travel to an area of high elevation, fly on an airplane, or go scuba diving. How is this diagnosed? This condition may be diagnosed based on:  Your symptoms.  A physical exam of your ears, nose, and throat.  Tests, such as those that measure: ? The movement of your eardrum (tympanogram). ? Your hearing (audiometry). How is this treated? Treatment depends on the cause and severity of your condition.  In mild cases, you may relieve your symptoms by moving air into your ears. This is called "popping the ears."  In more severe cases, or if you have symptoms of fluid in your ears, treatment may include: ? Medicines to relieve congestion (decongestants). ? Medicines that treat allergies (antihistamines). ? Nasal sprays or ear drops that contain medicines that reduce swelling (steroids). ? A procedure to drain the fluid in your eardrum (myringotomy). In this procedure, a small tube is placed in the eardrum to:  Drain the fluid.  Restore the air in the middle ear space. ? A procedure to insert a balloon device through the nose to inflate the opening of the eustachian tube (balloon dilation). Follow these instructions at home: Lifestyle  Do not do any of the following until your health care provider approves: ? Travel to high altitudes. ? Fly in airplanes. ? Work in a Estate agent or room. ? Scuba dive.  Do not use any products that contain nicotine or tobacco, such as cigarettes and e-cigarettes. If you need help  quitting, ask your health care provider.  Keep your ears dry. Wear fitted earplugs during showering and bathing. Dry your ears completely after. General instructions  Take over-the-counter and prescription medicines only as told by your health care provider.  Use techniques to help pop your ears as recommended by your health care provider. These may include: ? Chewing gum. ? Yawning. ? Frequent, forceful  swallowing. ? Closing your mouth, holding your nose closed, and gently blowing as if you are trying to blow air out of your nose.  Keep all follow-up visits as told by your health care provider. This is important. Contact a health care provider if:  Your symptoms do not go away after treatment.  Your symptoms come back after treatment.  You are unable to pop your ears.  You have: ? A fever. ? Pain in your ear. ? Pain in your head or neck. ? Fluid draining from your ear.  Your hearing suddenly changes.  You become very dizzy.  You lose your balance. Summary  Eustachian tube dysfunction refers to a condition in which a blockage develops in the eustachian tube.  It can be caused by ear infections, allergies, inhaled irritants, or abnormal growths in the nose or throat.  Symptoms include ear pain, hearing loss, or ringing in the ears.  Mild cases are treated with maneuvers to unblock the ears, such as yawning or ear popping.  Severe cases are treated with medicines. Surgery may also be done (rare). This information is not intended to replace advice given to you by your health care provider. Make sure you discuss any questions you have with your health care provider. Document Revised: 02/27/2018 Document Reviewed: 02/27/2018 Elsevier Patient Education  2021 [employer].

## 2021-03-07 ENCOUNTER — Encounter: Payer: Self-pay | Admitting: Internal Medicine

## 2021-03-07 DIAGNOSIS — E119 Type 2 diabetes mellitus without complications: Secondary | ICD-10-CM

## 2021-03-08 MED ORDER — METFORMIN HCL 1000 MG PO TABS: 1000.0000 mg | ORAL_TABLET | Freq: Two times a day (BID) | ORAL | 1 refills | Status: DC

## 2021-03-16 NOTE — Progress Notes (Signed)
Name: Debbie Hunt  Age/ Sex: 55 y.o., female   MRN/ DOB: [id number], [birth year]     PCP: Raliegh Ip, DO   Reason for Endocrinology Evaluation: Type 2 Diabetes Mellitus  Initial Endocrine Consultative Visit: 09/10/2020    PATIENT IDENTIFIER: Debbie Hunt is a 55 y.o. female with a past medical history of T2DM and Dyslipidemia . The patient has followed with Endocrinology clinic since 09/10/2020 for consultative assistance with management of her diabetes.  DIABETIC HISTORY:  Debbie Hunt was diagnosed with gestational diabetes in ~1995 then was diagnosed with T2DM in 2007. Her hemoglobin A1c has ranged from 7.0% in 2019, peaking at 10.3% in 2021.  On her initial visit to our clinic she had an A1c of 8.1 %. She was on Farxiga and Metformin, we stopped [name] due to recurrent yeast infection hx of about 6 months . Continue Metformin , started Glipizide and Rybelsus    Glipizide stopped 11/2020 with an A1c 6.0%    Father with hx of thyroid disease   SUBJECTIVE:   During the last visit (12/16/2020): A1c 6.0 % Stopped Glipizide and continued  Metformin and Rybelsus      Today (03/17/2021): Debbie Hunt is here for a follow up on diabetes management.  She checks her blood sugars 1 times daily. The patient has had hypoglycemic episodes since the last clinic visit, which typically occur 2 x / month- most often occuring during the day . The patient is symptomatic with these episodes.   Has mild occasional nausea but no diarrhea    Has the dexcom at home but has not used it yet.   HOME DIABETES REGIMEN:  Metformin 1000 mg Twice a day  Rybelsus 7 mg daily     Statin: no ACE-I/ARB: yes    METER DOWNLOAD SUMMARY: Date range evaluated: 4/13-4/27/2022  Average Number Tests/Day = 0.5 Overall Mean FS Glucose = 140 Standard Deviation = 20  BG Ranges: Low = 124 High = 180   Hypoglycemic Events/30 Days: BG < 50 = 0 Episodes of symptomatic severe  hypoglycemia = 0    DIABETIC COMPLICATIONS: Microvascular complications:    Denies: CKD, neuropathy, retinopathy  Last Eye Exam: Completed 11/2018- schedule 04/09/2021  Macrovascular complications:    Denies: CAD, CVA, PVD   HISTORY:  Past Medical History:  Past Medical History:  Diagnosis Date  . Diabetes mellitus without complication (HCC)   . Hyperlipidemia   . Hypertension    Past Surgical History:  Past Surgical History:  Procedure Laterality Date  . albaltion    . CHOLECYSTECTOMY      Social History:  reports that she has never smoked. She has never used smokeless tobacco. She reports that she does not drink alcohol and does not use drugs. Family History:  Family History  Problem Relation Age of Onset  . Diabetes Mother   . Hypertension Mother      HOME MEDICATIONS: Allergies as of 03/17/2021      Reactions   Codeine    Penicillins Hives      Medication List       Accurate as of March 17, 2021  7:40 AM. If you have any questions, ask your nurse or doctor.        Accu-Chek Softclix Lancets lancets Use as instructed to check BGs daily. E11.9   clotrimazole-betamethasone cream Commonly known as: LOTRISONE APPLY TO THE AFFECTED AND SURROUNDING AREAS OF SKIN THREE TIMES DAILY AS NEEDED   Dexcom G6 Sensor Misc 1 Device  by Does not apply route as directed.   Dexcom G6 Transmitter Misc 1 Device by Does not apply route as directed.   glucose blood test strip Check BGs once daily E11.9 (has accucheck machine)   hydrocortisone 25 MG suppository Commonly known as: ANUSOL-HC Place 1 suppository (25 mg total) rectally 2 (two) times daily as needed for hemorrhoids.   lisinopril-hydrochlorothiazide 20-12.5 MG tablet Commonly known as: ZESTORETIC Take 1 tablet by mouth daily.   metFORMIN 1000 MG tablet Commonly known as: GLUCOPHAGE Take 1 tablet (1,000 mg total) by mouth 2 (two) times daily with a meal.   Rybelsus 7 MG Tabs Generic drug:  Semaglutide Take 7 mg by mouth daily before breakfast.        OBJECTIVE:   Vital Signs: BP 110/70   Pulse (!) 57   Ht 5\' 7"  (1.702 m)   Wt 248 lb 6 oz (112.7 kg)   LMP 09/26/2014   SpO2 98%   BMI 38.90 kg/m   Wt Readings from Last 3 Encounters:  03/17/21 248 lb 6 oz (112.7 kg)  02/09/21 253 lb (114.8 kg)  12/16/20 256 lb 6 oz (116.3 kg)     Exam: General: Pt appears well and is in NAD  Lungs: Clear with good BS bilat with no rales, rhonchi, or wheezes  Heart: RRR   Abdomen: Normoactive bowel sounds, soft, nontender, without masses or organomegaly palpable  Extremities: No pretibial edema.   Neuro: MS is good with appropriate affect, pt is alert and Ox3    DM foot exam: 12/16/2020    The skin of the feet is intact without sores or ulcerations. The pedal pulses are 2+ on right and 2+ on left. The sensation is intact to a screening 5.07, 10 gram monofilament bilaterally     DATA REVIEWED:  Lab Results  Component Value Date   HGBA1C 6.4 (A) 03/17/2021   HGBA1C 6.0 (A) 12/16/2020   HGBA1C 8.1 (H) 08/12/2020   Lab Results  Component Value Date   LDLCALC 82 08/12/2020   CREATININE 0.76 08/12/2020   Lab Results  Component Value Date   MICRALBCREAT <28.0 05/03/2018     Lab Results  Component Value Date   CHOL 147 08/12/2020   HDL 47 08/12/2020   LDLCALC 82 08/12/2020   TRIG 98 08/12/2020   CHOLHDL 3.1 08/12/2020       Results for Debbie Hunt, Debbie Hunt (MRN Samson Frederic) as of 03/18/2021 09:12  Ref. Range 03/17/2021 07:55  Creatinine,U Latest Units: mg/dL 03/19/2021  Microalb, Ur Latest Ref Range: 0.0 - 1.9 mg/dL 40.9  MICROALB/CREAT RATIO Latest Ref Range: 0.0 - 30.0 mg/g 0.8    ASSESSMENT / PLAN / RECOMMENDATIONS:   1) Type 2 Diabetes Mellitus, Optimally controlled, Without complications - Most recent A1c of 6.4 %. Goal A1c < 7.0 %.    - Praised the pt on continued weight loss and continued optimization of glucose control  - Intolerant to <8.1 due to  recurrent genital infections    MEDICATIONS:  - Continue Metformin 1000 mg Twice a day  - Continue Rybelsus 7 mg, 1 tablet before Breakfast   EDUCATION / INSTRUCTIONS:  BG monitoring instructions: Patient is instructed to check her blood sugars 1 times a day, fasting.  Call [facility] if: BG persistently < 70 . I reviewed the Rule of 15 for the treatment of hypoglycemia in detail with the patient. Literature supplied.     2) Diabetic complications:   Eye: Does not have known diabetic retinopathy.  Neuro/ Feet: Does not have known diabetic peripheral neuropathy .   Renal: Patient does not have known baseline CKD. She   is  on an ACEI/ARB at present.    3) Lipids: Patient is not on a statin, LDL at 82 mg/dL.     F/U in 6 months     Signed electronically by: Lyndle Herrlich, MD  [facility] Endocrinology  [facility] [address] [place] [place], [place] [address] Phone: [phone] FAX: [phone]   CC: Raliegh Ip, DO [address] [place] [address] Phone: [phone]  Fax: [phone]  Return to Endocrinology clinic as below: Future Appointments  Date Time Provider Department Center  08/13/2021  9:00 AM Raliegh Ip, DO [facility] None

## 2021-03-17 ENCOUNTER — Encounter: Payer: Self-pay | Admitting: Internal Medicine

## 2021-03-17 ENCOUNTER — Other Ambulatory Visit: Payer: Self-pay

## 2021-03-17 ENCOUNTER — Ambulatory Visit: Payer: BC Managed Care – PPO | Admitting: Internal Medicine

## 2021-03-17 VITALS — BP 110/70 | HR 57 | Ht 67.0 in | Wt 248.4 lb

## 2021-03-17 DIAGNOSIS — E119 Type 2 diabetes mellitus without complications: Secondary | ICD-10-CM

## 2021-03-17 LAB — POCT GLYCOSYLATED HEMOGLOBIN (HGB A1C): Hemoglobin A1C: 6.4 % — AB (ref 4.0–5.6)

## 2021-03-17 LAB — MICROALBUMIN / CREATININE URINE RATIO
Creatinine,U: 82.7 mg/dL
Microalb Creat Ratio: 0.8 mg/g (ref 0.0–30.0)
Microalb, Ur: <0.7 mg/dL (ref 0.0–1.9)

## 2021-03-17 NOTE — Patient Instructions (Addendum)
-   Continue Metformin 1000 mg Twice a day  - Continue Rybelsus 7 mg, 1 tablet before Breakfast       HOW TO TREAT LOW BLOOD SUGARS (Blood sugar LESS THAN 70 MG/DL)  Please follow the RULE OF 15 for the treatment of hypoglycemia treatment (when your (blood sugars are less than 70 mg/dL)    STEP 1: Take 15 grams of carbohydrates when your blood sugar is low, which includes:   3-4 GLUCOSE TABS  OR  3-4 OZ OF JUICE OR REGULAR SODA OR  ONE TUBE OF GLUCOSE GEL     STEP 2: RECHECK blood sugar in 15 MINUTES STEP 3: If your blood sugar is still low at the 15 minute recheck --> then, go back to STEP 1 and treat AGAIN with another 15 grams of carbohydrates.

## 2021-05-19 ENCOUNTER — Ambulatory Visit (INDEPENDENT_AMBULATORY_CARE_PROVIDER_SITE_OTHER): Payer: BC Managed Care – PPO | Admitting: Nurse Practitioner

## 2021-05-19 ENCOUNTER — Encounter: Payer: Self-pay | Admitting: Nurse Practitioner

## 2021-05-19 DIAGNOSIS — J029 Acute pharyngitis, unspecified: Secondary | ICD-10-CM | POA: Diagnosis not present

## 2021-05-19 LAB — CULTURE, GROUP A STREP

## 2021-05-19 LAB — RAPID STREP SCREEN (MED CTR MEBANE ONLY): Strep Gp A Ag, IA W/Reflex: NEGATIVE

## 2021-05-19 MED ORDER — AZITHROMYCIN 250 MG PO TABS: ORAL_TABLET | ORAL | 0 refills | Status: AC

## 2021-05-19 MED ORDER — AZITHROMYCIN 250 MG PO TABS: ORAL_TABLET | ORAL | 0 refills | Status: DC

## 2021-05-19 NOTE — Assessment & Plan Note (Signed)
Unresolved sore throat symptoms with cough, and sinus drainage.  No headache, body aches or fever.  At home COVID-19 test negative. Comes to clinic for COVID PCR, and strep throat swab. Chloraseptic spray, warm salt gargle, ibuprofen/Tylenol for pain.  Azithromycin 250 mg by mouth.  Take medication as prescribed.  Rx sent to pharmacy

## 2021-05-19 NOTE — Progress Notes (Signed)
   Virtual Visit  Note Due to COVID-19 pandemic this visit was conducted virtually. This visit type was conducted due to national recommendations for restrictions regarding the COVID-19 Pandemic (e.g. social distancing, sheltering in place) in an effort to limit this patient's exposure and mitigate transmission in our community. All issues noted in this document were discussed and addressed.  A physical exam was not performed with this format.  I connected with Debbie Hunt on 05/19/21 at 08:15 am by telephone and verified that I am speaking with the correct person using two identifiers. Debbie Hunt is currently located at home during visit. The provider, Daryll Drown, NP is located in their office at time of visit.  I discussed the limitations, risks, security and privacy concerns of performing an evaluation and management service by telephone and the availability of in person appointments. I also discussed with the patient that there may be a patient responsible charge related to this service. The patient expressed understanding and agreed to proceed.   History and Present Illness:  Sore Throat  This is a new problem. Episode onset: In the past 3 days. The problem has been gradually worsening. Neither side of throat is experiencing more pain than the other. There has been no fever. The pain is moderate. Associated symptoms include coughing. Pertinent negatives include no abdominal pain, ear pain, headaches, swollen glands or trouble swallowing. She has had no exposure to strep. She has tried nothing for the symptoms.     Review of Systems  Constitutional:  Negative for chills and fever.  HENT:  Positive for sore throat. Negative for ear pain and trouble swallowing.   Respiratory:  Positive for cough.   Gastrointestinal:  Negative for abdominal pain.  Skin:  Negative for rash.  Neurological:  Negative for headaches.  All other systems reviewed and are  negative.   Observations/Objective: Tele visit, patient not in distress  Assessment and Plan:  Unresolved sore throat symptoms with cough, and sinus drainage.  No headache, body aches or fever.  At home COVID-19 test negative. Comes to clinic for COVID PCR, and strep throat swab. Chloraseptic spray, warm salt gargle, ibuprofen/Tylenol for pain.  Azithromycin 250 mg by mouth.  Take medication as prescribed.  Rx sent to pharmacy  Follow Up Instructions:  Follow-up with worsening unresolved symptoms.   I discussed the assessment and treatment plan with the patient. The patient was provided an opportunity to ask questions and all were answered. The patient agreed with the plan and demonstrated an understanding of the instructions.   The patient was advised to call back or seek an in-person evaluation if the symptoms worsen or if the condition fails to improve as anticipated.  The above assessment and management plan was discussed with the patient. The patient verbalized understanding of and has agreed to the management plan. Patient is aware to call the clinic if symptoms persist or worsen. Patient is aware when to return to the clinic for a follow-up visit. Patient educated on when it is appropriate to go to the emergency department.   Time call ended: 8:25 AM  I provided 10 minutes of  non face-to-face time during this encounter.    Daryll Drown, NP

## 2021-05-20 ENCOUNTER — Encounter: Payer: Self-pay | Admitting: Nurse Practitioner

## 2021-05-20 DIAGNOSIS — R059 Cough, unspecified: Secondary | ICD-10-CM

## 2021-05-20 LAB — NOVEL CORONAVIRUS, NAA: SARS-CoV-2, NAA: NOT DETECTED

## 2021-05-20 LAB — SARS-COV-2, NAA 2 DAY TAT

## 2021-05-21 ENCOUNTER — Other Ambulatory Visit: Payer: Self-pay | Admitting: Nurse Practitioner

## 2021-05-21 DIAGNOSIS — R059 Cough, unspecified: Secondary | ICD-10-CM | POA: Insufficient documentation

## 2021-05-21 MED ORDER — BENZONATATE 100 MG PO CAPS: 100.0000 mg | ORAL_CAPSULE | Freq: Three times a day (TID) | ORAL | 0 refills | Status: DC | PRN

## 2021-08-09 ENCOUNTER — Telehealth: Payer: BC Managed Care – PPO | Admitting: Physician Assistant

## 2021-08-09 DIAGNOSIS — J020 Streptococcal pharyngitis: Secondary | ICD-10-CM | POA: Diagnosis not present

## 2021-08-09 MED ORDER — AZITHROMYCIN 250 MG PO TABS
ORAL_TABLET | ORAL | 0 refills | Status: DC
Start: 2021-08-09 — End: 2021-09-16

## 2021-08-09 NOTE — Progress Notes (Signed)
E-Visit for Sore Throat - Strep Symptoms  We are sorry that you are not feeling well.  Here is how we plan to help!  Based on what you have shared with me it is likely that you have strep pharyngitis.  Strep pharyngitis is inflammation and infection in the back of the throat.  This is an infection cause by bacteria and is treated with antibiotics.  I have prescribed Azithromycin 250 mg two tablets today and then one daily for 4 additional days. For throat pain, we recommend over the counter oral pain relief medications such as acetaminophen or aspirin, or anti-inflammatory medications such as ibuprofen or naproxen sodium. Topical treatments such as oral throat lozenges or sprays may be used as needed. Strep infections are not as easily transmitted as other respiratory infections, however we still recommend that you avoid close contact with loved ones, especially the very young and elderly.  Remember to wash your hands thoroughly throughout the day as this is the number one way to prevent the spread of infection and wipe down door knobs and counters with disinfectant.   Home Care: Only take medications as instructed by your medical team. Complete the entire course of an antibiotic. Do not take these medications with alcohol. A steam or ultrasonic humidifier can help congestion.  You can place a towel over your head and breathe in the steam from hot water coming from a faucet. Avoid close contacts especially the very young and the elderly. Cover your mouth when you cough or sneeze. Always remember to wash your hands.  Get Help Right Away If: You develop worsening fever or sinus pain. You develop a severe head ache or visual changes. Your symptoms persist after you have completed your treatment plan.  Make sure you Understand these instructions. Will watch your condition. Will get help right away if you are not doing well or get worse.   Thank you for choosing an e-visit.  Your e-visit  answers were reviewed by a board certified advanced clinical practitioner to complete your personal care plan. Depending upon the condition, your plan could have included both over the counter or prescription medications.  Please review your pharmacy choice. Make sure the pharmacy is open so you can pick up prescription now. If there is a problem, you may contact your provider through MyChart messaging and have the prescription routed to another pharmacy.  Your safety is important to us. If you have drug allergies check your prescription carefully.   For the next 24 hours you can use MyChart to ask questions about today's visit, request a non-urgent call back, or ask for a work or school excuse. You will get an email in the next two days asking about your experience. I hope that your e-visit has been valuable and will speed your recovery.  I provided 5 minutes of non face-to-face time during this encounter for chart review and documentation.   

## 2021-08-10 ENCOUNTER — Other Ambulatory Visit: Payer: Self-pay | Admitting: Family Medicine

## 2021-08-10 ENCOUNTER — Encounter: Payer: Self-pay | Admitting: Family Medicine

## 2021-08-10 MED ORDER — MOLNUPIRAVIR EUA 200MG CAPSULE: 4.0000 | ORAL_CAPSULE | Freq: Two times a day (BID) | ORAL | 0 refills | Status: AC

## 2021-08-10 NOTE — Telephone Encounter (Signed)
Spoke to pt.  Molnupiravir sent

## 2021-08-13 ENCOUNTER — Encounter: Payer: BC Managed Care – PPO | Admitting: Family Medicine

## 2021-08-27 DIAGNOSIS — Z01419 Encounter for gynecological examination (general) (routine) without abnormal findings: Secondary | ICD-10-CM | POA: Diagnosis not present

## 2021-08-27 DIAGNOSIS — Z1231 Encounter for screening mammogram for malignant neoplasm of breast: Secondary | ICD-10-CM | POA: Diagnosis not present

## 2021-09-01 ENCOUNTER — Other Ambulatory Visit: Payer: Self-pay | Admitting: Family Medicine

## 2021-09-01 DIAGNOSIS — E1169 Type 2 diabetes mellitus with other specified complication: Secondary | ICD-10-CM

## 2021-09-13 ENCOUNTER — Other Ambulatory Visit: Payer: Self-pay | Admitting: Internal Medicine

## 2021-09-16 ENCOUNTER — Other Ambulatory Visit: Payer: Self-pay

## 2021-09-16 ENCOUNTER — Encounter: Payer: Self-pay | Admitting: Internal Medicine

## 2021-09-16 ENCOUNTER — Ambulatory Visit: Payer: BC Managed Care – PPO | Admitting: Internal Medicine

## 2021-09-16 VITALS — BP 120/76 | HR 64 | Ht 67.0 in | Wt 234.0 lb

## 2021-09-16 DIAGNOSIS — E1169 Type 2 diabetes mellitus with other specified complication: Secondary | ICD-10-CM

## 2021-09-16 DIAGNOSIS — E785 Hyperlipidemia, unspecified: Secondary | ICD-10-CM | POA: Diagnosis not present

## 2021-09-16 DIAGNOSIS — E119 Type 2 diabetes mellitus without complications: Secondary | ICD-10-CM | POA: Diagnosis not present

## 2021-09-16 LAB — POCT GLYCOSYLATED HEMOGLOBIN (HGB A1C): Hemoglobin A1C: 6.3 % — AB (ref 4.0–5.6)

## 2021-09-16 LAB — BASIC METABOLIC PANEL
BUN: 12 mg/dL (ref 6–23)
CO2: 27 mEq/L (ref 19–32)
Calcium: 9.6 mg/dL (ref 8.4–10.5)
Chloride: 103 mEq/L (ref 96–112)
Creatinine, Ser: 0.72 mg/dL (ref 0.40–1.20)
GFR: 94.97 mL/min (ref >60.00)
Glucose, Bld: 112 mg/dL — ABNORMAL HIGH (ref 70–99)
Potassium: 3.7 mEq/L (ref 3.5–5.1)
Sodium: 140 mEq/L (ref 135–145)

## 2021-09-16 LAB — LIPID PANEL
Cholesterol: 147 mg/dL (ref 0–200)
HDL: 46.1 mg/dL (ref >39.00)
LDL Cholesterol: 80 mg/dL (ref 0–99)
NonHDL: 101.27
Total CHOL/HDL Ratio: 3
Triglycerides: 104 mg/dL (ref 0.0–149.0)
VLDL: 20.8 mg/dL (ref 0.0–40.0)

## 2021-09-16 LAB — MICROALBUMIN / CREATININE URINE RATIO
Creatinine,U: 36 mg/dL
Microalb Creat Ratio: 1.9 mg/g (ref 0.0–30.0)
Microalb, Ur: <0.7 mg/dL (ref 0.0–1.9)

## 2021-09-16 LAB — GLUCOSE, POCT (MANUAL RESULT ENTRY): POC Glucose: 128 mg/dl — AB (ref 70–99)

## 2021-09-16 NOTE — Patient Instructions (Signed)
-   Continue Metformin 1000 mg Twice a day  - Continue Rybelsus 7 mg, 1 tablet before Breakfast       HOW TO TREAT LOW BLOOD SUGARS (Blood sugar LESS THAN 70 MG/DL)  Please follow the RULE OF 15 for the treatment of hypoglycemia treatment (when your (blood sugars are less than 70 mg/dL)    STEP 1: Take 15 grams of carbohydrates when your blood sugar is low, which includes:   3-4 GLUCOSE TABS  OR  3-4 OZ OF JUICE OR REGULAR SODA OR  ONE TUBE OF GLUCOSE GEL     STEP 2: RECHECK blood sugar in 15 MINUTES STEP 3: If your blood sugar is still low at the 15 minute recheck --> then, go back to STEP 1 and treat AGAIN with another 15 grams of carbohydrates.  

## 2021-09-16 NOTE — Progress Notes (Signed)
Name: Debbie Hunt  Age/ Sex: 55 y.o., female   MRN/ DOB: [id number], 1966-08-10     PCP: Raliegh Ip, DO   Reason for Endocrinology Evaluation: Type 2 Diabetes Mellitus  Initial Endocrine Consultative Visit: 09/10/2020    PATIENT IDENTIFIER: Debbie Hunt is a 55 y.o. female with a past medical history of T2DM and Dyslipidemia . The patient has followed with Endocrinology clinic since 09/10/2020 for consultative assistance with management of her diabetes.  DIABETIC HISTORY:  Debbie Hunt was diagnosed with gestational diabetes in ~1995 then was diagnosed with T2DM in 2007. Her hemoglobin A1c has ranged from 7.0% in 2019, peaking at 10.3% in 2021.  On her initial visit to our clinic she had an A1c of 8.1 %. She was on Farxiga and Metformin, we stopped [name] due to recurrent yeast infection hx of about 6 months . Continue Metformin , started Glipizide and Rybelsus    Glipizide stopped 11/2020 with an A1c 6.0%     Father with hx of thyroid disease   SUBJECTIVE:   During the last visit (03/17/2021): A1c 6.4%  continued  Metformin and Rybelsus      Today (09/16/2021): Debbie Hunt is here for a follow up on diabetes management.  She checks her blood sugars 1 times daily. The patient has not had hypoglycemic episodes since the last clinic visit.   Had COVID in 07/2021    HOME DIABETES REGIMEN:  Metformin 1000 mg Twice a day  Rybelsus 7 mg daily      Statin: no ACE-I/ARB: yes    METER DOWNLOAD SUMMARY: Did not bring   Memory recall   DIABETIC COMPLICATIONS: Microvascular complications:   Denies: CKD, neuropathy, retinopathy Last Eye Exam: Completed 04/09/2021  Macrovascular complications:   Denies: CAD, CVA, PVD   HISTORY:  Past Medical History:  Past Medical History:  Diagnosis Date   Diabetes mellitus without complication (HCC)    Hyperlipidemia    Hypertension    Past Surgical History:  Past Surgical History:  Procedure  Laterality Date   albaltion     CHOLECYSTECTOMY     Social History:  reports that she has never smoked. She has never used smokeless tobacco. She reports that she does not drink alcohol and does not use drugs. Family History:  Family History  Problem Relation Age of Onset   Diabetes Mother    Hypertension Mother      HOME MEDICATIONS: Allergies as of 09/16/2021       Reactions   Codeine    Penicillins Hives        Medication List        Accurate as of September 16, 2021  7:37 AM. If you have any questions, ask your nurse or doctor.          STOP taking these medications    azithromycin 250 MG tablet Commonly known as: ZITHROMAX Stopped by: Scarlette Shorts, MD   benzonatate 100 MG capsule Commonly known as: Lawyer Stopped by: Scarlette Shorts, MD       TAKE these medications    Accu-Chek Guide test strip Generic drug: glucose blood Check BGs once daily E11.9   Accu-Chek Softclix Lancets lancets Use as instructed to check BGs daily. E11.9   clotrimazole-betamethasone cream Commonly known as: LOTRISONE APPLY TO THE AFFECTED AND SURROUNDING AREAS OF SKIN THREE TIMES DAILY AS NEEDED   Dexcom G6 Sensor Misc 1 Device by Does not apply route as directed.   Dexcom G6 Transmitter  Misc 1 Device by Does not apply route as directed.   hydrocortisone 25 MG suppository Commonly known as: ANUSOL-HC Place 1 suppository (25 mg total) rectally 2 (two) times daily as needed for hemorrhoids.   lisinopril-hydrochlorothiazide 20-12.5 MG tablet Commonly known as: ZESTORETIC Take 1 tablet by mouth daily.   metFORMIN 1000 MG tablet Commonly known as: GLUCOPHAGE Take 1 tablet (1,000 mg total) by mouth 2 (two) times daily with a meal.   Rybelsus 7 MG Tabs Generic drug: Semaglutide TAKE 1 TABLET DAILY BEFORE BREAKFAST         OBJECTIVE:   Vital Signs: BP 120/76 (BP Location: Left Arm, Patient Position: Sitting, Cuff Size: Small)   Pulse 64    Ht 5\' 7"  (1.702 m)   Wt 234 lb (106.1 kg)   LMP 09/26/2014   SpO2 95%   BMI 36.65 kg/m   Wt Readings from Last 3 Encounters:  09/16/21 234 lb (106.1 kg)  03/17/21 248 lb 6 oz (112.7 kg)  02/09/21 253 lb (114.8 kg)     Exam: General: Pt appears well and is in NAD  Lungs: Clear with good BS bilat with no rales, rhonchi, or wheezes  Heart: RRR   Abdomen: Normoactive bowel sounds, soft, nontender, without masses or organomegaly palpable  Extremities: No pretibial edema.   Neuro: MS is good with appropriate affect, pt is alert and Ox3    DM foot exam: 12/16/2020    The skin of the feet is intact without sores or ulcerations. The pedal pulses are 2+ on right and 2+ on left. The sensation is intact to a screening 5.07, 10 gram monofilament bilaterally     DATA REVIEWED:  Lab Results  Component Value Date   HGBA1C 6.4 (A) 03/17/2021   HGBA1C 6.0 (A) 12/16/2020   HGBA1C 8.1 (H) 08/12/2020   Results for KASAUNDRA, FAHRNEY (MRN Samson Frederic) as of 09/17/2021 09:16  Ref. Range 09/16/2021 07:54  Sodium Latest Ref Range: 135 - 145 mEq/L 140  Potassium Latest Ref Range: 3.5 - 5.1 mEq/L 3.7  Chloride Latest Ref Range: 96 - 112 mEq/L 103  CO2 Latest Ref Range: 19 - 32 mEq/L 27  Glucose Latest Ref Range: 70 - 99 mg/dL 09/18/2021 (H)  BUN Latest Ref Range: 6 - 23 mg/dL 12  Creatinine Latest Ref Range: 0.40 - 1.20 mg/dL 893  Calcium Latest Ref Range: 8.4 - 10.5 mg/dL 9.6  GFR Latest Ref Range: >60.00 mL/min 94.97  Total CHOL/HDL Ratio Unknown 3  Cholesterol Latest Ref Range: 0 - 200 mg/dL 8.10  HDL Cholesterol Latest Ref Range: >39.00 mg/dL 175  LDL (calc) Latest Ref Range: 0 - 99 mg/dL 80  MICROALB/CREAT RATIO Latest Ref Range: 0.0 - 30.0 mg/g 1.9  NonHDL Unknown 101.27  Triglycerides Latest Ref Range: 0.0 - 149.0 mg/dL 10.25  VLDL Latest Ref Range: 0.0 - 40.0 mg/dL 852.7  Creatinine,U Latest Units: mg/dL 78.2  Microalb, Ur Latest Ref Range: 0.0 - 1.9 mg/dL 42.3    In Office BG <5.3  mg/dL   ASSESSMENT / PLAN / RECOMMENDATIONS:   1) Type 2 Diabetes Mellitus, Optimally controlled, Without complications - Most recent A1c of 6.3 %. Goal A1c < 7.0 %.    - Praised the pt on continued weight loss and continued optimization of glucose control  - Intolerant to 614 due to recurrent genital infections    MEDICATIONS:  - Continue Metformin 1000 mg Twice a day  - Continue Rybelsus 7 mg, 1 tablet before Breakfast   EDUCATION /  INSTRUCTIONS: BG monitoring instructions: Patient is instructed to check her blood sugars 1 times a day, fasting. Call [facility] if: BG persistently < 70 I reviewed the Rule of 15 for the treatment of hypoglycemia in detail with the patient. Literature supplied.     2) Diabetic complications:  Eye: Does not have known diabetic retinopathy.  Neuro/ Feet: Does not have known diabetic peripheral neuropathy .  Renal: Patient does not have known baseline CKD. She   is  on an ACEI/ARB at present.    3) Lipids: Patient is not on a statin, LDL at 80 mg/dL. No indication for statin therapy at this time    F/U in 6 months     Signed electronically by: Lyndle Herrlich, MD  [facility] Endocrinology  [facility] [address] [place] [place], [place] [address] Phone: [phone] FAX: [phone]   CC: Raliegh Ip, DO [address] [place] [address] Phone: [phone]  Fax: [phone]  Return to Endocrinology clinic as below: No future appointments.

## 2021-10-29 ENCOUNTER — Telehealth: Payer: BC Managed Care – PPO | Admitting: Physician Assistant

## 2021-10-29 DIAGNOSIS — J029 Acute pharyngitis, unspecified: Secondary | ICD-10-CM

## 2021-10-29 MED ORDER — IPRATROPIUM BROMIDE 0.03 % NA SOLN: 2.0000 | Freq: Two times a day (BID) | NASAL | 0 refills | Status: DC

## 2021-10-29 NOTE — Progress Notes (Signed)
E-Visit for Sore Throat  We are sorry that you are not feeling well.  Here is how we plan to help!  Your symptoms indicate a likely viral infection (Pharyngitis).   Pharyngitis is inflammation in the back of the throat which can cause a sore throat, scratchiness and sometimes difficulty swallowing.   Pharyngitis is typically caused by a respiratory virus and will just run its course.  Please keep in mind that your symptoms could last up to 10 days.  For throat pain, we recommend over the counter oral pain relief medications such as acetaminophen or aspirin, or anti-inflammatory medications such as ibuprofen or naproxen sodium.  Topical treatments such as oral throat lozenges or sprays may be used as needed.  I will prescribe Ipratropium bromide nasal spray for nasal congestion and drainage. Avoid close contact with loved ones, especially the very young and elderly.  Remember to wash your hands thoroughly throughout the day as this is the number one way to prevent the spread of infection and wipe down door knobs and counters with disinfectant.  After careful review of your answers, I would not recommend an antibiotic for your condition.  Antibiotics should not be used to treat conditions that we suspect are caused by viruses like the virus that causes the common cold or flu. However, some people can have Strep with atypical symptoms. You may need formal testing in clinic or office to confirm if your symptoms continue or worsen.  Providers prescribe antibiotics to treat infections caused by bacteria. Antibiotics are very powerful in treating bacterial infections when they are used properly.  To maintain their effectiveness, they should be used only when necessary.  Overuse of antibiotics has resulted in the development of super bugs that are resistant to treatment!    Home Care: Only take medications as instructed by your medical team. Do not drink alcohol while taking these medications. A steam or  ultrasonic humidifier can help congestion.  You can place a towel over your head and breathe in the steam from hot water coming from a faucet. Avoid close contacts especially the very young and the elderly. Cover your mouth when you cough or sneeze. Always remember to wash your hands.  Get Help Right Away If: You develop worsening fever or throat pain. You develop a severe head ache or visual changes. Your symptoms persist after you have completed your treatment plan.  Make sure you Understand these instructions. Will watch your condition. Will get help right away if you are not doing well or get worse.   Thank you for choosing an e-visit.  Your e-visit answers were reviewed by a board certified advanced clinical practitioner to complete your personal care plan. Depending upon the condition, your plan could have included both over the counter or prescription medications.  Please review your pharmacy choice. Make sure the pharmacy is open so you can pick up prescription now. If there is a problem, you may contact your provider through [employer] and have the prescription routed to another pharmacy.  Your safety is important to [place]. If you have drug allergies check your prescription carefully.   For the next 24 hours you can use MyChart to ask questions about today's visit, request a non-urgent call back, or ask for a work or school excuse. You will get an email in the next two days asking about your experience. I hope that your e-visit has been valuable and will speed your recovery.  I provided 5 minutes of non face-to-face time  during this encounter for chart review and documentation.   

## 2021-11-24 ENCOUNTER — Ambulatory Visit (INDEPENDENT_AMBULATORY_CARE_PROVIDER_SITE_OTHER): Payer: BC Managed Care – PPO

## 2021-11-24 ENCOUNTER — Ambulatory Visit: Payer: BC Managed Care – PPO | Admitting: Nurse Practitioner

## 2021-11-24 ENCOUNTER — Encounter: Payer: Self-pay | Admitting: Nurse Practitioner

## 2021-11-24 VITALS — BP 117/78 | HR 81 | Temp 98.6°F | Ht 67.0 in | Wt 230.2 lb

## 2021-11-24 DIAGNOSIS — R1031 Right lower quadrant pain: Secondary | ICD-10-CM | POA: Diagnosis not present

## 2021-11-24 DIAGNOSIS — R109 Unspecified abdominal pain: Secondary | ICD-10-CM | POA: Insufficient documentation

## 2021-11-24 MED ORDER — IBUPROFEN 600 MG PO TABS: 600.0000 mg | ORAL_TABLET | Freq: Three times a day (TID) | ORAL | 0 refills | Status: DC | PRN

## 2021-11-24 NOTE — Patient Instructions (Signed)

## 2021-11-24 NOTE — Progress Notes (Signed)
Acute Office Visit  Subjective:    Patient ID: Debbie Hunt, female    DOB: September 19, 1966, 56 y.o.   MRN: [id number]  Chief Complaint  Patient presents with   GI Problem    GI Problem The primary symptoms include abdominal pain. Primary symptoms do not include rash.  Patient is in today for Abdominal Pain: Patient complains of abdominal pain. The pain is described as aching, and is 1/10 in intensity. Pain is located in the RUQ without radiation. Onset was a few weeks ago. Symptoms have been unchanged since. Aggravating factors: activity.  Alleviating factors: none. Associated symptoms: none. The patient denies anorexia, constipation, diarrhea, fever, nausea, and vomiting.    Past Medical History:  Diagnosis Date   Diabetes mellitus without complication (HCC)    Hyperlipidemia    Hypertension     Past Surgical History:  Procedure Laterality Date   albaltion     CHOLECYSTECTOMY      Family History  Problem Relation Age of Onset   Diabetes Mother    Hypertension Mother     Social History   Socioeconomic History   Marital status: Married    Spouse name: Not on file   Number of children: Not on file   Years of education: Not on file   Highest education level: Not on file  Occupational History   Not on file  Tobacco Use   Smoking status: Never   Smokeless tobacco: Never  Vaping Use   Vaping Use: Never used  Substance and Sexual Activity   Alcohol use: No   Drug use: No   Sexual activity: Yes  Other Topics Concern   Not on file  Social History Narrative   Not on file   Social Determinants of Health   Financial Resource Strain: Not on file  Food Insecurity: Not on file  Transportation Needs: Not on file  Physical Activity: Not on file  Stress: Not on file  Social Connections: Not on file  Intimate Partner Violence: Not on file    Outpatient Medications Prior to Visit  Medication Sig Dispense Refill   Accu-Chek Softclix Lancets lancets Use as  instructed to check BGs daily. E11.9 100 each 12   glucose blood (ACCU-CHEK GUIDE) test strip Check BGs once daily E11.9 100 strip 3   hydrocortisone (ANUSOL-HC) 25 MG suppository Place 1 suppository (25 mg total) rectally 2 (two) times daily as needed for hemorrhoids. 12 suppository 2   lisinopril-hydrochlorothiazide (ZESTORETIC) 20-12.5 MG tablet Take 1 tablet by mouth daily. 90 tablet 3   metFORMIN (GLUCOPHAGE) 1000 MG tablet Take 1 tablet (1,000 mg total) by mouth 2 (two) times daily with a meal. 180 tablet 1   RYBELSUS 7 MG TABS TAKE 1 TABLET DAILY BEFORE BREAKFAST 90 tablet 0   Continuous Blood Gluc Sensor (DEXCOM G6 SENSOR) MISC 1 Device by Does not apply route as directed. (Patient not taking: Reported on 11/24/2021) 9 each 3   Continuous Blood Gluc Transmit (DEXCOM G6 TRANSMITTER) MISC 1 Device by Does not apply route as directed. (Patient not taking: Reported on 11/24/2021) 1 each 3   clotrimazole-betamethasone (LOTRISONE) cream APPLY TO THE AFFECTED AND SURROUNDING AREAS OF SKIN THREE TIMES DAILY AS NEEDED     ipratropium (ATROVENT) 0.03 % nasal spray Place 2 sprays into both nostrils every 12 (twelve) hours. 30 mL 0   No facility-administered medications prior to visit.    Allergies  Allergen Reactions   Codeine    Penicillins Hives    Review  of Systems  Constitutional: Negative.   HENT: Negative.    Eyes: Negative.   Respiratory: Negative.    Cardiovascular: Negative.   Gastrointestinal:  Positive for abdominal pain.  Skin: Negative.  Negative for rash.  All other systems reviewed and are negative.     Objective:    Physical Exam Vitals and nursing note reviewed.  Constitutional:      Appearance: Normal appearance. She is obese.  HENT:     Nose: Nose normal.  Eyes:     Conjunctiva/sclera: Conjunctivae normal.  Cardiovascular:     Pulses: Normal pulses.     Heart sounds: Normal heart sounds.  Pulmonary:     Effort: Pulmonary effort is normal.     Breath sounds:  Normal breath sounds.  Abdominal:     General: Bowel sounds are normal.     Tenderness: There is abdominal tenderness.  Musculoskeletal:        General: Normal range of motion.  Skin:    General: Skin is warm.     Findings: No rash.  Neurological:     Mental Status: She is alert and oriented to person, place, and time.  Psychiatric:        Behavior: Behavior normal.    BP 117/78    Pulse 81    Temp 98.6 F (37 C) (Temporal)    Ht 5\' 7"  (1.702 m)    Wt 230 lb 4 oz (104.4 kg)    LMP 09/26/2014    BMI 36.06 kg/m  Wt Readings from Last 3 Encounters:  11/24/21 230 lb 4 oz (104.4 kg)  09/16/21 234 lb (106.1 kg)  03/17/21 248 lb 6 oz (112.7 kg)    Health Maintenance Due  Topic Date Due   COLONOSCOPY (Pts 45-69yrs Insurance coverage will need to be confirmed)  Never done   Zoster Vaccines- Shingrix (1 of 2) Never done   OPHTHALMOLOGY EXAM  07/04/2019   PAP SMEAR-Modifier  06/06/2020   Pneumococcal Vaccine 72-60 Years old (2 - PCV) 08/15/2020   COVID-19 Vaccine (4 - Booster for Moderna series) 01/01/2021   INFLUENZA VACCINE  06/21/2021    There are no preventive care reminders to display for this patient.   Lab Results  Component Value Date   TSH 1.770 08/12/2020   Lab Results  Component Value Date   WBC 5.8 08/16/2019   HGB 12.6 08/16/2019   HCT 38.3 08/16/2019   MCV 86 08/16/2019   PLT 217 08/16/2019   Lab Results  Component Value Date   NA 140 09/16/2021   K 3.7 09/16/2021   CO2 27 09/16/2021   GLUCOSE 112 (H) 09/16/2021   BUN 12 09/16/2021   CREATININE 0.72 09/16/2021   BILITOT 0.7 08/12/2020   ALKPHOS 75 08/12/2020   AST 18 08/12/2020   ALT 16 08/12/2020   PROT 6.9 08/12/2020   ALBUMIN 4.5 08/12/2020   CALCIUM 9.6 09/16/2021   GFR 94.97 09/16/2021   Lab Results  Component Value Date   CHOL 147 09/16/2021   Lab Results  Component Value Date   HDL 46.10 09/16/2021   Lab Results  Component Value Date   LDLCALC 80 09/16/2021   Lab Results   Component Value Date   TRIG 104.0 09/16/2021   Lab Results  Component Value Date   CHOLHDL 3 09/16/2021   Lab Results  Component Value Date   HGBA1C 6.3 (A) 09/16/2021       Assessment & Plan:   Problem List Items Addressed This Visit  Other   Right lower quadrant abdominal pain - Primary   Relevant Medications   ibuprofen (ADVIL) 600 MG tablet   Other Relevant Orders   DG Abd 1 View  Abdominal pain not well controlled, patient may be having pain from scar tissues  After surgery of gallbladder removal. antiinflammatory for pain , KUB completed results pending. No other symptoms associated with complaints.  Patient knows to follow up with worsening unresolved symptoms.    Meds ordered this encounter  Medications   ibuprofen (ADVIL) 600 MG tablet    Sig: Take 1 tablet (600 mg total) by mouth every 8 (eight) hours as needed.    Dispense:  30 tablet    Refill:  0    Order Specific Question:   Supervising Provider    Answer:   Mechele ClaudeSTACKS, WARREN [[id number]]     Daryll Drownnyeje M Trayshawn Durkin, NP

## 2021-12-06 ENCOUNTER — Other Ambulatory Visit: Payer: Self-pay | Admitting: Internal Medicine

## 2021-12-06 DIAGNOSIS — E119 Type 2 diabetes mellitus without complications: Secondary | ICD-10-CM

## 2021-12-20 ENCOUNTER — Other Ambulatory Visit: Payer: Self-pay | Admitting: Internal Medicine

## 2021-12-20 ENCOUNTER — Encounter: Payer: Self-pay | Admitting: Internal Medicine

## 2021-12-21 MED ORDER — RYBELSUS 7 MG PO TABS: 1.0000 | ORAL_TABLET | Freq: Every day | ORAL | 0 refills | Status: DC

## 2022-01-17 ENCOUNTER — Encounter: Payer: Self-pay | Admitting: Family Medicine

## 2022-03-08 ENCOUNTER — Other Ambulatory Visit: Payer: Self-pay | Admitting: Internal Medicine

## 2022-03-17 ENCOUNTER — Ambulatory Visit: Payer: BC Managed Care – PPO | Admitting: Internal Medicine

## 2022-03-23 ENCOUNTER — Encounter: Payer: Self-pay | Admitting: Internal Medicine

## 2022-03-23 ENCOUNTER — Ambulatory Visit (INDEPENDENT_AMBULATORY_CARE_PROVIDER_SITE_OTHER): Payer: BC Managed Care – PPO | Admitting: Internal Medicine

## 2022-03-23 VITALS — BP 112/74 | HR 78 | Ht 67.0 in | Wt 226.0 lb

## 2022-03-23 DIAGNOSIS — E119 Type 2 diabetes mellitus without complications: Secondary | ICD-10-CM

## 2022-03-23 DIAGNOSIS — R739 Hyperglycemia, unspecified: Secondary | ICD-10-CM

## 2022-03-23 DIAGNOSIS — R1031 Right lower quadrant pain: Secondary | ICD-10-CM

## 2022-03-23 DIAGNOSIS — M79671 Pain in right foot: Secondary | ICD-10-CM | POA: Diagnosis not present

## 2022-03-23 LAB — POCT GLYCOSYLATED HEMOGLOBIN (HGB A1C): Hemoglobin A1C: 6.2 % — AB (ref 4.0–5.6)

## 2022-03-23 LAB — POCT GLUCOSE (DEVICE FOR HOME USE): Glucose Fasting, POC: 113 mg/dL — AB (ref 70–99)

## 2022-03-23 MED ORDER — RYBELSUS 7 MG PO TABS: 1.0000 | ORAL_TABLET | Freq: Every day | ORAL | 3 refills | Status: DC

## 2022-03-23 MED ORDER — METFORMIN HCL 1000 MG PO TABS: 1000.0000 mg | ORAL_TABLET | Freq: Two times a day (BID) | ORAL | 3 refills | Status: DC

## 2022-03-23 NOTE — Progress Notes (Signed)
?Name: Debbie Hunt  ?Age/ Sex: 56 y.o., female   ?MRN/ DOB: [id number], 08/24/1966    ? ?PCP: Raliegh IpGottschalk, Ashly M, DO   ?Reason for Endocrinology Evaluation: Type 2 Diabetes Mellitus  ?Initial Endocrine Consultative Visit: 09/10/2020  ? ? ?PATIENT IDENTIFIER: Debbie Hunt is a 56 y.o. female with a past medical history of T2DM and Dyslipidemia . The patient has followed with Endocrinology clinic since 09/10/2020 for consultative assistance with management of her diabetes. ? ?DIABETIC HISTORY:  ?Debbie Hunt was diagnosed with gestational diabetes in ~1995 then was diagnosed with T2DM in 2007. Her hemoglobin A1c has ranged from 7.0% in 2019, peaking at 10.3% in 2021. ? ?On her initial visit to our clinic she had an A1c of 8.1 %. She was on Farxiga and Metformin, we stopped Marcelline DeistFarxiga due to recurrent yeast infection hx of about 6 months . Continue Metformin , started Glipizide and Rybelsus  ? ? ?Glipizide stopped 11/2020 with an A1c 6.0%  ? ?  ?Father with hx of thyroid disease  ? ?SUBJECTIVE:  ? ?During the last visit (09/16/2021): A1c 6.3%  continued  Metformin and Rybelsus  ? ? ? ? ?Today (03/23/2022): Debbie Hunt is here for a follow up on diabetes management.  She checks her blood sugars 1 times daily. The patient has not had hypoglycemic episodes since the last clinic visit. ? ? ?Had COVID in 07/2021  ?Denies nausea, vomiting or diarrhea  ?She has been having hot flashes and erratic sleep  ?She has a new grandson, was stressful because daughter had placenta abruptio  ? ?Has right heel pain , essentials oils help  ?Has right lower quadrant , per pt work up per PCP was negative, up to date on pelvic exam  ?Up to date on colonoscopy  ?The pain is triggered by moving in bed  ? ?HOME DIABETES REGIMEN:  ?Metformin 1000 mg Twice a day  ?Rybelsus 7 mg daily  ?  ? ? ?Statin: no ?ACE-I/ARB: yes ? ? ? ?METER DOWNLOAD SUMMARY: Did not bring  ? ? ? ?DIABETIC COMPLICATIONS: ?Microvascular complications:  ? ?Denies:  CKD, neuropathy, retinopathy ?Last Eye Exam: Completed 03/24/2022 ? ?Macrovascular complications:  ? ?Denies: CAD, CVA, PVD ? ? ?HISTORY:  ?Past Medical History:  ?Past Medical History:  ?Diagnosis Date  ? Diabetes mellitus without complication (HCC)   ? Hyperlipidemia   ? Hypertension   ? ?Past Surgical History:  ?Past Surgical History:  ?Procedure Laterality Date  ? albaltion    ? CHOLECYSTECTOMY    ? ?Social History:  reports that she has never smoked. She has never used smokeless tobacco. She reports that she does not drink alcohol and does not use drugs. ?Family History:  ?Family History  ?Problem Relation Age of Onset  ? Diabetes Mother   ? Hypertension Mother   ? ? ? ?HOME MEDICATIONS: ?Allergies as of 03/23/2022   ? ?   Reactions  ? Codeine   ? Penicillins Hives  ? ?  ? ?  ?Medication List  ?  ? ?  ? Accurate as of Mar 23, 2022  9:05 AM. If you have any questions, ask your nurse or doctor.  ?  ?  ? ?  ? ?Accu-Chek Guide test strip ?Generic drug: glucose blood ?Check BGs once daily E11.9 ?  ?Accu-Chek Softclix Lancets lancets ?Use as instructed to check BGs daily. E11.9 ?  ?Dexcom G6 Sensor Misc ?1 Device by Does not apply route as directed. ?  ?Dexcom G6 Transmitter Misc ?  1 Device by Does not apply route as directed. ?  ?hydrocortisone 25 MG suppository ?Commonly known as: ANUSOL-HC ?Place 1 suppository (25 mg total) rectally 2 (two) times daily as needed for hemorrhoids. ?  ?ibuprofen 600 MG tablet ?Commonly known as: ADVIL ?Take 1 tablet (600 mg total) by mouth every 8 (eight) hours as needed. ?  ?lisinopril-hydrochlorothiazide 20-12.5 MG tablet ?Commonly known as: ZESTORETIC ?Take 1 tablet by mouth daily. ?  ?metFORMIN 1000 MG tablet ?Commonly known as: GLUCOPHAGE ?TAKE 1 TABLET (1,000 MG TOTAL) BY MOUTH 2 (TWO) TIMES DAILY WITH A MEAL. ?  ?Rybelsus 7 MG Tabs ?Generic drug: Semaglutide ?TAKE 1 TABLET DAILY BEFORE BREAKFAST ?  ? ?  ? ? ? ?OBJECTIVE:  ? ?Vital Signs: BP 112/74 (BP Location: Left Arm, Patient  Position: Sitting, Cuff Size: Large)   Pulse 78   Ht 5\' 7"  (1.702 m)   Wt 226 lb (102.5 kg)   LMP 09/26/2014   SpO2 96%   BMI 35.40 kg/m?   ?Wt Readings from Last 3 Encounters:  ?03/23/22 226 lb (102.5 kg)  ?11/24/21 230 lb 4 oz (104.4 kg)  ?09/16/21 234 lb (106.1 kg)  ? ? ? ?Exam: ?General: Pt appears well and is in NAD  ?Lungs: Clear with good BS bilat with no rales, rhonchi, or wheezes  ?Heart: RRR   ?Abdomen: Normoactive bowel sounds, soft, nontender, without masses or organomegaly palpable  ?Extremities: No pretibial edema.   ?Neuro: MS is good with appropriate affect, pt is alert and Ox3  ? ? ?DM foot exam: 15/01/2022 ?   ?The skin of the feet is intact without sores or ulcerations. ?The pedal pulses are 2+ on right and 2+ on left. ?The sensation is intact to a screening 5.07, 10 gram monofilament bilaterally ? ? ? ? ?DATA REVIEWED: ? ?Lab Results  ?Component Value Date  ? HGBA1C 6.2 (A) 03/23/2022  ? HGBA1C 6.3 (A) 09/16/2021  ? HGBA1C 6.4 (A) 03/17/2021  ? ?Results for JOEY, HUDOCK (MRN Debbie Frederic) as of 09/17/2021 09:16 ? Ref. Range 09/16/2021 07:54  ?Sodium Latest Ref Range: 135 - 145 mEq/L 140  ?Potassium Latest Ref Range: 3.5 - 5.1 mEq/L 3.7  ?Chloride Latest Ref Range: 96 - 112 mEq/L 103  ?CO2 Latest Ref Range: 19 - 32 mEq/L 27  ?Glucose Latest Ref Range: 70 - 99 mg/dL 09/18/2021 (H)  ?BUN Latest Ref Range: 6 - 23 mg/dL 12  ?Creatinine Latest Ref Range: 0.40 - 1.20 mg/dL 119  ?Calcium Latest Ref Range: 8.4 - 10.5 mg/dL 9.6  ?GFR Latest Ref Range: >60.00 mL/min 94.97  ?Total CHOL/HDL Ratio Unknown 3  ?Cholesterol Latest Ref Range: 0 - 200 mg/dL 1.47  ?HDL Cholesterol Latest Ref Range: >39.00 mg/dL 829  ?LDL (calc) Latest Ref Range: 0 - 99 mg/dL 80  ?MICROALB/CREAT RATIO Latest Ref Range: 0.0 - 30.0 mg/g 1.9  ?NonHDL Unknown 101.27  ?Triglycerides Latest Ref Range: 0.0 - 149.0 mg/dL 56.21  ?VLDL Latest Ref Range: 0.0 - 40.0 mg/dL 308.6  ?Creatinine,U Latest Units: mg/dL 57.8  ?Microalb, Ur Latest Ref  Range: 0.0 - 1.9 mg/dL 46.9  ? ? ?In Office BG 112 mg/dL  ? ?ASSESSMENT / PLAN / RECOMMENDATIONS:  ? ?1) Type 2 Diabetes Mellitus, Optimally controlled, Without complications - Most recent A1c of 6.2 %. Goal A1c < 7.0 %.   ? ?- Praised the pt on continued weight loss and continued optimization of glucose control  ?- Intolerant to <6.2 due to recurrent genital infections ? ? ? ?MEDICATIONS: ? ?-  Continue Metformin 1000 mg Twice a day  ?- Continue Rybelsus 7 mg, 1 tablet before Breakfast  ? ?EDUCATION / INSTRUCTIONS: ?BG monitoring instructions: Patient is instructed to check her blood sugars 1 times a day, fasting. ?Call [facility] if: BG persistently < 70 ?I reviewed the Rule of 15 for the treatment of hypoglycemia in detail with the patient. Literature supplied. ? ? ? ? ?2) Diabetic complications:  ?Eye: Does not have known diabetic retinopathy.  ?Neuro/ Feet: Does not have known diabetic peripheral neuropathy .  ?Renal: Patient does not have known baseline CKD. She   is  on an ACEI/ARB at present.  ? ? ?3) Right heel pain : ? ? ?- Discussed D/D plantar fascitis, internal bruising from tight shoes vs bone spur ?- She has been using essential oils  ?- Does not like to use OTC meds ?- Offered a referral to podiatry, they have one in [place] and contemplating a visit  ? ?4) RLQ pain : ? ? ?- Per pt her workup through PCP is negative, up to date on pelvic exams and colonoscopy  ?-  The pain is described as burning, exacerbated by changing position in bed, this seems more towards musculoskeletal  ?- She sees a chiropractor for back issues  ? ? ? ?F/U in 6 months   ? ? ?Signed electronically by: ?Abby Raelyn Mora, MD ? ?[facility]  ?[facility] ?[address] ?[place], [place] [address] ?Phone: [phone] ?FAX: [phone] ? ? ?CC: ?Raliegh Ip, DO ?[address] [place] [address] ?Phone: [phone]  ?Fax: [phone] ? ?Return to  Endocrinology clinic as below: ?Future Appointments  ?Date Time Provider Department Center  ?03/23/2022  9:10 AM Saul Fabiano, Konrad Dolores, MD [facility] None  ? ?  ? ? ?

## 2022-03-23 NOTE — Patient Instructions (Signed)
-   Continue Metformin 1000 mg Twice a day  - Continue Rybelsus 7 mg, 1 tablet before Breakfast       HOW TO TREAT LOW BLOOD SUGARS (Blood sugar LESS THAN 70 MG/DL)  Please follow the RULE OF 15 for the treatment of hypoglycemia treatment (when your (blood sugars are less than 70 mg/dL)    STEP 1: Take 15 grams of carbohydrates when your blood sugar is low, which includes:   3-4 GLUCOSE TABS  OR  3-4 OZ OF JUICE OR REGULAR SODA OR  ONE TUBE OF GLUCOSE GEL     STEP 2: RECHECK blood sugar in 15 MINUTES STEP 3: If your blood sugar is still low at the 15 minute recheck --> then, go back to STEP 1 and treat AGAIN with another 15 grams of carbohydrates.  

## 2022-04-09 ENCOUNTER — Other Ambulatory Visit: Payer: Self-pay | Admitting: Family Medicine

## 2022-04-09 DIAGNOSIS — I10 Essential (primary) hypertension: Secondary | ICD-10-CM

## 2022-04-11 ENCOUNTER — Encounter: Payer: Self-pay | Admitting: Family Medicine

## 2022-04-11 DIAGNOSIS — I10 Essential (primary) hypertension: Secondary | ICD-10-CM

## 2022-04-11 MED ORDER — LISINOPRIL-HYDROCHLOROTHIAZIDE 20-12.5 MG PO TABS: 1.0000 | ORAL_TABLET | Freq: Every day | ORAL | 0 refills | Status: DC

## 2022-04-11 NOTE — Telephone Encounter (Signed)
Gottschalk. Last chronic ckup 02/09/21 mail order NOT sent

## 2022-04-11 NOTE — Telephone Encounter (Signed)
Called patient to set up appt. She thinks that her Endocrinologist is calling in this medicine for her now but is going to call them to make sure and call us back if she needs an appt.

## 2022-04-26 ENCOUNTER — Ambulatory Visit: Payer: BC Managed Care – PPO | Admitting: Family Medicine

## 2022-04-26 ENCOUNTER — Encounter: Payer: Self-pay | Admitting: Family Medicine

## 2022-04-26 VITALS — BP 129/79 | HR 70 | Temp 97.5°F | Ht 67.0 in | Wt 228.8 lb

## 2022-04-26 DIAGNOSIS — I152 Hypertension secondary to endocrine disorders: Secondary | ICD-10-CM | POA: Diagnosis not present

## 2022-04-26 DIAGNOSIS — E1159 Type 2 diabetes mellitus with other circulatory complications: Secondary | ICD-10-CM

## 2022-04-26 DIAGNOSIS — H938X1 Other specified disorders of right ear: Secondary | ICD-10-CM | POA: Diagnosis not present

## 2022-04-26 MED ORDER — LISINOPRIL-HYDROCHLOROTHIAZIDE 20-12.5 MG PO TABS: 1.0000 | ORAL_TABLET | Freq: Every day | ORAL | 3 refills | Status: DC

## 2022-04-26 NOTE — Progress Notes (Signed)
Subjective: CC: Hypertension PCP: Debbie Norlander, DO [id number] Debbie Debbie is a 56 y.o. female presenting to clinic today for:  1.  Hypertension associate with diabetes Patient is under excellent control with diabetes.  She has been making major lifestyle modifications which have resulted in weight loss.  Currently treated with Rybelsus, metformin and Zestoretic.  She had labs done with her endocrinologist which were normal.  She reports doing really well and recently her daughter had a son.  She is really been enjoying spending time with her new grandson and has gotten to see him pretty much every day.  They are looking to move closer as well which she is really looking forward to.  She does not report any chest pain, shortness of breath, visual disturbance, edema.  She works primarily from home now.  2.  Right ear irritation Patient reports intermittent right ear irritation.  Sometimes she feels like she just needs to wiggle it to make it feel better.  She does not use anything in her ears including Q-tips because she does not want to cause a cerumen impaction.  She does not take any oral antihistamines.  She does not report any drainage or dizziness.  No ear pain.  ROS: Per HPI  Allergies  Allergen Reactions   Codeine    Penicillins Hives   Past Medical History:  Diagnosis Date   Diabetes mellitus without complication ([place])    Hyperlipidemia    Hypertension     Current Outpatient Medications:    Accu-Chek Softclix Lancets lancets, Use as instructed to check BGs daily. E11.9, Disp: 100 each, Rfl: 12   glucose blood (ACCU-CHEK GUIDE) test strip, Check BGs once daily E11.9, Disp: 100 strip, Rfl: 3   hydrocortisone (ANUSOL-HC) 25 MG suppository, Place 1 suppository (25 mg total) rectally 2 (two) times daily as needed for hemorrhoids., Disp: 12 suppository, Rfl: 2   ibuprofen (ADVIL) 600 MG tablet, Take 1 tablet (600 mg total) by mouth every 8 (eight) hours as needed., Disp: 30  tablet, Rfl: 0   lisinopril-hydrochlorothiazide (ZESTORETIC) 20-12.5 MG tablet, Take 1 tablet by mouth daily., Disp: 30 tablet, Rfl: 0   metFORMIN (GLUCOPHAGE) 1000 MG tablet, Take 1 tablet (1,000 mg total) by mouth 2 (two) times daily with a meal., Disp: 180 tablet, Rfl: 3   Semaglutide (RYBELSUS) 7 MG TABS, Take 1 tablet by mouth daily before breakfast., Disp: 90 tablet, Rfl: 3 Social History   Socioeconomic History   Marital status: Married    Spouse name: Not on file   Number of children: Not on file   Years of education: Not on file   Highest education level: Not on file  Occupational History   Not on file  Tobacco Use   Smoking status: Never   Smokeless tobacco: Never  Vaping Use   Vaping Use: Never used  Substance and Sexual Activity   Alcohol use: No   Drug use: No   Sexual activity: Yes  Other Topics Concern   Not on file  Social History Narrative   Not on file   Social Determinants of Health   Financial Resource Strain: Not on file  Food Insecurity: Not on file  Transportation Needs: Not on file  Physical Activity: Not on file  Stress: Not on file  Social Connections: Not on file  Intimate Partner Violence: Not on file   Family History  Problem Relation Age of Onset   Diabetes Mother    Hypertension Mother     Objective:  Office vital signs reviewed. BP 129/79   Pulse 70   Temp (!) 97.5 F (36.4 C)   Ht 5\' 7"  (1.702 m)   Wt 228 lb 12.8 oz (103.8 kg)   LMP 09/26/2014   SpO2 100%   BMI 35.84 kg/m   Physical Examination:  General: Awake, alert, well nourished, well appearing female. No acute distress HEENT: sclera white, MMM.  Right TM normal.  She has some scant cerumen in the external auditory canal but no significant inflammation or dermatitis appreciated Cardio: regular rate and rhythm, S1S2 heard, no murmurs appreciated Pulm: clear to auscultation bilaterally, no wheezes, rhonchi or rales; normal work of breathing on room air Extremities: warm,  well perfused, No edema, cyanosis or clubbing; +2 pulses bilaterally MSK: normal gait and station  Assessment/ Plan: 56 y.o. female   Hypertension associated with diabetes ([place]) - Plan: lisinopril-hydrochlorothiazide (ZESTORETIC) 20-12.5 MG tablet  Irritation of ear, right  Blood pressure under excellent control.  Continue to monitor blood pressures at home.  As she continues to lose weight I would recommend reevaluating her blood pressure regimen so as not to precipitate a hypotensive episode.  For now, things are stable so continue current regimen.  Refills have been sent.  No labs collected today since all of these were collected recently by her endocrinologist  Irritation of the right ear may be secondary to allergy or just dry skin.  There were no significant findings on exam today.  We discussed homeopathic remedies for this irritation.  She will follow-up as needed on this issue  No orders of the defined types were placed in this encounter.  No orders of the defined types were placed in this encounter.    Debbie Norlander, DO [place] [phone]

## 2022-05-03 ENCOUNTER — Other Ambulatory Visit: Payer: Self-pay | Admitting: Family Medicine

## 2022-05-03 DIAGNOSIS — I152 Hypertension secondary to endocrine disorders: Secondary | ICD-10-CM

## 2022-06-08 ENCOUNTER — Other Ambulatory Visit: Payer: Self-pay | Admitting: Internal Medicine

## 2022-06-08 DIAGNOSIS — E119 Type 2 diabetes mellitus without complications: Secondary | ICD-10-CM

## 2022-07-07 ENCOUNTER — Encounter: Payer: Self-pay | Admitting: Family Medicine

## 2022-07-07 NOTE — Telephone Encounter (Signed)
So just call if you start experiencing symptoms and we can make you an appointment we can not just call in the medicine

## 2022-07-21 ENCOUNTER — Telehealth: Payer: Self-pay | Admitting: Pharmacy Technician

## 2022-07-21 ENCOUNTER — Other Ambulatory Visit (HOSPITAL_COMMUNITY): Payer: Self-pay

## 2022-07-21 NOTE — Telephone Encounter (Signed)
Received letter back from pt's ins that Rybelsus doesn't require a prior auth. Based on the letter previously received, it will be required starting October 1st. Saved form for when we need to request again.

## 2022-07-21 NOTE — Telephone Encounter (Signed)
Patient Advocate Encounter   Received notification from [facility] that prior authorization for Rybelsus is required/requested.  Per Test Claim: pt had 90 day supply filled in July   PA submitted on 07/21/22 to [facility] via CoverMyMeds Key [id number] Status is pending  Pharmacy Patient Advocate Fax:  [phone]

## 2022-08-04 ENCOUNTER — Telehealth: Payer: BC Managed Care – PPO | Admitting: Family Medicine

## 2022-08-04 DIAGNOSIS — Z20818 Contact with and (suspected) exposure to other bacterial communicable diseases: Secondary | ICD-10-CM

## 2022-08-04 MED ORDER — AZITHROMYCIN 250 MG PO TABS: ORAL_TABLET | ORAL | 0 refills | Status: AC

## 2022-08-04 NOTE — Progress Notes (Signed)
E-Visit for Sore Throat - Strep Symptoms  We are sorry that you are not feeling well.  Here is how we plan to help!  Based on what you have shared with me it is likely that you have strep pharyngitis.  Strep pharyngitis is inflammation and infection in the back of the throat.  This is an infection cause by bacteria and is treated with antibiotics.  I have prescribed Azithromycin 250 mg two tablets today and then one daily for 4 additional days. For throat pain, we recommend over the counter oral pain relief medications such as acetaminophen or aspirin, or anti-inflammatory medications such as ibuprofen or naproxen sodium. Topical treatments such as oral throat lozenges or sprays may be used as needed. Strep infections are not as easily transmitted as other respiratory infections, however we still recommend that you avoid close contact with loved ones, especially the very young and elderly.  Remember to wash your hands thoroughly throughout the day as this is the number one way to prevent the spread of infection and wipe down door knobs and counters with disinfectant.   Home Care: Only take medications as instructed by your medical team. Complete the entire course of an antibiotic. Do not take these medications with alcohol. A steam or ultrasonic humidifier can help congestion.  You can place a towel over your head and breathe in the steam from hot water coming from a faucet. Avoid close contacts especially the very young and the elderly. Cover your mouth when you cough or sneeze. Always remember to wash your hands.  Get Help Right Away If: You develop worsening fever or sinus pain. You develop a severe head ache or visual changes. Your symptoms persist after you have completed your treatment plan.  Make sure you Understand these instructions. Will watch your condition. Will get help right away if you are not doing well or get worse.   Thank you for choosing an e-visit.  Your e-visit  answers were reviewed by a board certified advanced clinical practitioner to complete your personal care plan. Depending upon the condition, your plan could have included both over the counter or prescription medications.  Please review your pharmacy choice. Make sure the pharmacy is open so you can pick up prescription now. If there is a problem, you may contact your provider through MyChart messaging and have the prescription routed to another pharmacy.  Your safety is important to us. If you have drug allergies check your prescription carefully.   For the next 24 hours you can use MyChart to ask questions about today's visit, request a non-urgent call back, or ask for a work or school excuse. You will get an email in the next two days asking about your experience. I hope that your e-visit has been valuable and will speed your recovery.  I provided 5 minutes of non face-to-face time during this encounter for chart review, medication and order placement, as well as and documentation.    

## 2022-09-12 ENCOUNTER — Other Ambulatory Visit (HOSPITAL_COMMUNITY): Payer: Self-pay

## 2022-09-13 ENCOUNTER — Encounter: Payer: Self-pay | Admitting: Internal Medicine

## 2022-09-13 ENCOUNTER — Other Ambulatory Visit (HOSPITAL_COMMUNITY): Payer: Self-pay

## 2022-09-13 ENCOUNTER — Telehealth: Payer: Self-pay

## 2022-09-13 NOTE — Telephone Encounter (Signed)
Pharmacy Patient Advocate Encounter   Received notification from [facility] that prior authorization for Rybelsus 7MG  tablets is required/requested.   PA submitted on 09/13/2022 to [place] via [place] Status is pending

## 2022-09-14 ENCOUNTER — Other Ambulatory Visit (HOSPITAL_COMMUNITY): Payer: Self-pay

## 2022-09-15 ENCOUNTER — Other Ambulatory Visit (HOSPITAL_COMMUNITY): Payer: Self-pay

## 2022-09-15 DIAGNOSIS — Z01419 Encounter for gynecological examination (general) (routine) without abnormal findings: Secondary | ICD-10-CM | POA: Diagnosis not present

## 2022-09-15 DIAGNOSIS — Z1231 Encounter for screening mammogram for malignant neoplasm of breast: Secondary | ICD-10-CM | POA: Diagnosis not present

## 2022-09-15 DIAGNOSIS — Z124 Encounter for screening for malignant neoplasm of cervix: Secondary | ICD-10-CM | POA: Diagnosis not present

## 2022-09-15 NOTE — Telephone Encounter (Signed)
Received notification from [facility] regarding a prior authorization for Rybelsus 7mg .   Authorization has been APPROVED from 09/14/2022 to 09/14/2025.    Key: [id number]

## 2022-09-15 NOTE — Telephone Encounter (Signed)
Approval letter for Rybelsus scanned to chart.

## 2022-09-20 ENCOUNTER — Ambulatory Visit: Payer: BC Managed Care – PPO | Admitting: Internal Medicine

## 2022-09-20 ENCOUNTER — Encounter: Payer: Self-pay | Admitting: Internal Medicine

## 2022-09-20 VITALS — BP 124/82 | HR 80 | Wt 223.0 lb

## 2022-09-20 DIAGNOSIS — E119 Type 2 diabetes mellitus without complications: Secondary | ICD-10-CM | POA: Diagnosis not present

## 2022-09-20 LAB — POCT GLYCOSYLATED HEMOGLOBIN (HGB A1C): Hemoglobin A1C: 6 % — AB (ref 4.0–5.6)

## 2022-09-20 MED ORDER — RYBELSUS 7 MG PO TABS: 1.0000 | ORAL_TABLET | Freq: Every day | ORAL | 3 refills | Status: DC

## 2022-09-20 MED ORDER — METFORMIN HCL 1000 MG PO TABS: 1000.0000 mg | ORAL_TABLET | Freq: Every day | ORAL | 3 refills | Status: DC

## 2022-09-20 NOTE — Progress Notes (Signed)
Name: Debbie Hunt  Age/ Sex: 56 y.o., female   MRN/ DOB: [id number], 03-14-1966     PCP: Raliegh Ip, DO   Reason for Endocrinology Evaluation: Type 2 Diabetes Mellitus  Initial Endocrine Consultative Visit: 09/10/2020    PATIENT IDENTIFIER: Debbie Hunt is a 56 y.o. female with a past medical history of T2DM and Dyslipidemia . The patient has followed with Endocrinology clinic since 09/10/2020 for consultative assistance with management of her diabetes.  DIABETIC HISTORY:  Debbie Hunt was diagnosed with gestational diabetes in ~1995 then was diagnosed with T2DM in 2007. Her hemoglobin A1c has ranged from 7.0% in 2019, peaking at 10.3% in 2021.  On her initial visit to our clinic she had an A1c of 8.1 %. She was on Farxiga and Metformin, we stopped [name] due to recurrent yeast infection hx of about 6 months . Continue Metformin , started Glipizide and Rybelsus    Glipizide stopped 11/2020 with an A1c 6.0%     Father with hx of thyroid disease   SUBJECTIVE:   During the last visit (03/23/2022): A1c 6.2%      Today (09/20/2022): Debbie Hunt is here for a follow up on diabetes management.  She checks her blood sugars 1 times daily. The patient has not had hypoglycemic episodes since the last clinic visit.  Patient has been noted with weight loss Had a new tattoo on the left wrist with grandson he is 7 months  She walks with grandson " Brewer"  Denies nausea, vomiting or diarrhea   HOME DIABETES REGIMEN:  Metformin 1000 mg Twice a day  Rybelsus 7 mg daily      Statin: no ACE-I/ARB: yes    METER DOWNLOAD SUMMARY: unable to download  105-158 mg/dL   DIABETIC COMPLICATIONS: Microvascular complications:   Denies: CKD, neuropathy, retinopathy Last Eye Exam: Completed 03/24/2022  Macrovascular complications:   Denies: CAD, CVA, PVD   HISTORY:  Past Medical History:  Past Medical History:  Diagnosis Date   Diabetes mellitus without  complication (HCC)    Hyperlipidemia    Hypertension    Past Surgical History:  Past Surgical History:  Procedure Laterality Date   albaltion     CHOLECYSTECTOMY     Social History:  reports that she has never smoked. She has never used smokeless tobacco. She reports that she does not drink alcohol and does not use drugs. Family History:  Family History  Problem Relation Age of Onset   Diabetes Mother    Hypertension Mother      HOME MEDICATIONS: Allergies as of 09/20/2022       Reactions   Codeine    Penicillins Hives        Medication List        Accurate as of September 20, 2022  7:36 AM. If you have any questions, ask your nurse or doctor.          Accu-Chek Guide test strip Generic drug: glucose blood Check BGs once daily E11.9   Accu-Chek Softclix Lancets lancets Use as instructed to check BGs daily. E11.9   hydrocortisone 25 MG suppository Commonly known as: ANUSOL-HC Place 1 suppository (25 mg total) rectally 2 (two) times daily as needed for hemorrhoids.   lisinopril-hydrochlorothiazide 20-12.5 MG tablet Commonly known as: ZESTORETIC Take 1 tablet by mouth daily.   metFORMIN 1000 MG tablet Commonly known as: GLUCOPHAGE TAKE 1 TABLET (1,000 MG TOTAL) BY MOUTH TWICE A DAY WITH FOOD   Rybelsus 7 MG Tabs Generic  drug: Semaglutide Take 1 tablet by mouth daily before breakfast.         OBJECTIVE:   Vital Signs: BP 124/82 (BP Location: Left Arm, Patient Position: Sitting, Cuff Size: Large)   Pulse 80   Wt 223 lb (101.2 kg)   LMP 09/26/2014   SpO2 94%   BMI 34.93 kg/m   Wt Readings from Last 3 Encounters:  09/20/22 223 lb (101.2 kg)  04/26/22 228 lb 12.8 oz (103.8 kg)  03/23/22 226 lb (102.5 kg)     Exam: General: Pt appears well and is in NAD  Lungs: Clear with good BS bilat with no rales, rhonchi, or wheezes  Heart: RRR   Abdomen: Normoactive bowel sounds, soft, nontender, without masses or organomegaly palpable  Extremities: No  pretibial edema.   Neuro: MS is good with appropriate affect, pt is alert and Ox3    DM foot exam: 03/23/2022    The skin of the feet is intact without sores or ulcerations. The pedal pulses are 2+ on right and 2+ on left. The sensation is intact to a screening 5.07, 10 gram monofilament bilaterally     DATA REVIEWED:  Lab Results  Component Value Date   HGBA1C 6.0 (A) 09/20/2022   HGBA1C 6.2 (A) 03/23/2022   HGBA1C 6.3 (A) 09/16/2021   Results for Debbie Hunt, Debbie Hunt (MRN [id number]) as of 09/17/2021 09:16  Ref. Range 09/16/2021 07:54  Sodium Latest Ref Range: 135 - 145 mEq/L 140  Potassium Latest Ref Range: 3.5 - 5.1 mEq/L 3.7  Chloride Latest Ref Range: 96 - 112 mEq/L 103  CO2 Latest Ref Range: 19 - 32 mEq/L 27  Glucose Latest Ref Range: 70 - 99 mg/dL 542 (H)  BUN Latest Ref Range: 6 - 23 mg/dL 12  Creatinine Latest Ref Range: 0.40 - 1.20 mg/dL 7.06  Calcium Latest Ref Range: 8.4 - 10.5 mg/dL 9.6  GFR Latest Ref Range: >60.00 mL/min 94.97  Total CHOL/HDL Ratio Unknown 3  Cholesterol Latest Ref Range: 0 - 200 mg/dL 237  HDL Cholesterol Latest Ref Range: >39.00 mg/dL 62.83  LDL (calc) Latest Ref Range: 0 - 99 mg/dL 80  MICROALB/CREAT RATIO Latest Ref Range: 0.0 - 30.0 mg/g 1.9  NonHDL Unknown 101.27  Triglycerides Latest Ref Range: 0.0 - 149.0 mg/dL 151.7  VLDL Latest Ref Range: 0.0 - 40.0 mg/dL 61.6  Creatinine,U Latest Units: mg/dL 07.3  Microalb, Ur Latest Ref Range: 0.0 - 1.9 mg/dL <7.1      ASSESSMENT / PLAN / RECOMMENDATIONS:   1) Type 2 Diabetes Mellitus, Optimally controlled, Without complications - Most recent A1c of 6.0 %. Goal A1c < 7.0 %.    - Praised the pt on continued weight loss and continued optimization of glucose control  - Intolerant to [place] due to recurrent genital infections - Will reduce Metformin as below as her A1c 6.0%     MEDICATIONS:  - Decrease Metformin 1000 mg once daily  - Continue Rybelsus 7 mg, 1 tablet before Breakfast    EDUCATION / INSTRUCTIONS: BG monitoring instructions: Patient is instructed to check her blood sugars 1 times a day, fasting. Call [facility] if: BG persistently < 70 I reviewed the Rule of 15 for the treatment of hypoglycemia in detail with the patient. Literature supplied.     2) Diabetic complications:  Eye: Does not have known diabetic retinopathy.  Neuro/ Feet: Does not have known diabetic peripheral neuropathy .  Renal: Patient does not have known baseline CKD. She   is  on an ACEI/ARB at present.     F/U in 6 months     Signed electronically by: Mack Guise, MD  [facility] Endocrin[facility]  [facility] [address] [place], [place] [address] Phone: [phone] FAX: [phone]   CC: Janora Norlander, DO [place] [address] Phone: [phone]  Fax: [phone]  Return to Endocrinology clinic as below: No future appointments.

## 2022-09-20 NOTE — Patient Instructions (Signed)
-   Decrease Metformin 1000 mg  once daily  - Continue Rybelsus 7 mg, 1 tablet before Breakfast     HOW TO TREAT LOW BLOOD SUGARS (Blood sugar LESS THAN 70 MG/DL) Please follow the RULE OF 15 for the treatment of hypoglycemia treatment (when your (blood sugars are less than 70 mg/dL)   STEP 1: Take 15 grams of carbohydrates when your blood sugar is low, which includes:  3-4 GLUCOSE TABS  OR 3-4 OZ OF JUICE OR REGULAR SODA OR ONE TUBE OF GLUCOSE GEL    STEP 2: RECHECK blood sugar in 15 MINUTES STEP 3: If your blood sugar is still low at the 15 minute recheck --> then, go back to STEP 1 and treat AGAIN with another 15 grams of carbohydrates.

## 2022-10-27 ENCOUNTER — Encounter: Payer: Self-pay | Admitting: Internal Medicine

## 2022-11-08 ENCOUNTER — Telehealth: Payer: BC Managed Care – PPO | Admitting: Physician Assistant

## 2022-11-08 ENCOUNTER — Other Ambulatory Visit: Payer: Self-pay

## 2022-11-08 DIAGNOSIS — E119 Type 2 diabetes mellitus without complications: Secondary | ICD-10-CM

## 2022-11-08 DIAGNOSIS — J020 Streptococcal pharyngitis: Secondary | ICD-10-CM

## 2022-11-08 MED ORDER — METFORMIN HCL 1000 MG PO TABS: 2000.0000 mg | ORAL_TABLET | Freq: Every day | ORAL | 1 refills | Status: DC

## 2022-11-08 MED ORDER — AZITHROMYCIN 250 MG PO TABS: ORAL_TABLET | ORAL | 0 refills | Status: AC

## 2022-11-08 NOTE — Addendum Note (Signed)
Addended by: Marcelline Mates C on: 11/08/2022 11:09 AM   Modules accepted: Orders

## 2022-11-08 NOTE — Progress Notes (Signed)
I have spent 5 minutes in review of e-visit questionnaire, review and updating patient chart, medical decision making and response to patient.   Avi Archuleta Cody Milley Vining, PA-C    

## 2022-11-08 NOTE — Progress Notes (Addendum)

## 2022-11-18 ENCOUNTER — Ambulatory Visit: Payer: BC Managed Care – PPO | Admitting: Family Medicine

## 2022-12-01 ENCOUNTER — Other Ambulatory Visit: Payer: Self-pay | Admitting: Nurse Practitioner

## 2022-12-01 DIAGNOSIS — R1031 Right lower quadrant pain: Secondary | ICD-10-CM

## 2022-12-05 ENCOUNTER — Encounter: Payer: Self-pay | Admitting: Family Medicine

## 2022-12-14 ENCOUNTER — Encounter (INDEPENDENT_AMBULATORY_CARE_PROVIDER_SITE_OTHER): Payer: BC Managed Care – PPO | Admitting: Family Medicine

## 2022-12-14 DIAGNOSIS — B001 Herpesviral vesicular dermatitis: Secondary | ICD-10-CM | POA: Diagnosis not present

## 2022-12-14 NOTE — Telephone Encounter (Signed)
Dr. Lajuana Ripple,  Do you want to see the pt first before prescribing?

## 2022-12-16 MED ORDER — ACYCLOVIR 5 % EX OINT: 1.0000 | TOPICAL_OINTMENT | Freq: Four times a day (QID) | CUTANEOUS | 0 refills | Status: DC | PRN

## 2022-12-16 NOTE — Telephone Encounter (Signed)

## 2023-01-31 ENCOUNTER — Telehealth: Payer: BC Managed Care – PPO | Admitting: Physician Assistant

## 2023-01-31 DIAGNOSIS — J029 Acute pharyngitis, unspecified: Secondary | ICD-10-CM

## 2023-01-31 MED ORDER — AZITHROMYCIN 250 MG PO TABS: ORAL_TABLET | ORAL | 0 refills | Status: AC

## 2023-01-31 NOTE — Progress Notes (Signed)
E-Visit for Sore Throat - Strep Symptoms  We are sorry that you are not feeling well.  Here is how we plan to help!  Based on what you have shared with me it is likely that you have strep pharyngitis.  Strep pharyngitis is inflammation and infection in the back of the throat.  This is an infection cause by bacteria and is treated with antibiotics.  I have prescribed Azithromycin 250 mg two tablets today and then one daily for 4 additional days. For throat pain, we recommend over the counter oral pain relief medications such as acetaminophen or aspirin, or anti-inflammatory medications such as ibuprofen or naproxen sodium. Topical treatments such as oral throat lozenges or sprays may be used as needed. Strep infections are not as easily transmitted as other respiratory infections, however we still recommend that you avoid close contact with loved ones, especially the very young and elderly.  Remember to wash your hands thoroughly throughout the day as this is the number one way to prevent the spread of infection and wipe down door knobs and counters with disinfectant.  If you have recurrent symptoms of strep throat recommend an in person evaluation as you have had strep several times in the last through months.   Home Care: Only take medications as instructed by your medical team. Complete the entire course of an antibiotic. Do not take these medications with alcohol. A steam or ultrasonic humidifier can help congestion.  You can place a towel over your head and breathe in the steam from hot water coming from a faucet. Avoid close contacts especially the very young and the elderly. Cover your mouth when you cough or sneeze. Always remember to wash your hands.  Get Help Right Away If: You develop worsening fever or sinus pain. You develop a severe head ache or visual changes. Your symptoms persist after you have completed your treatment plan.  Make sure you Understand these instructions. Will  watch your condition. Will get help right away if you are not doing well or get worse.   Thank you for choosing an e-visit.  Your e-visit answers were reviewed by a board certified advanced clinical practitioner to complete your personal care plan. Depending upon the condition, your plan could have included both over the counter or prescription medications.  Please review your pharmacy choice. Make sure the pharmacy is open so you can pick up prescription now. If there is a problem, you may contact your provider through [employer] and have the prescription routed to another pharmacy.  Your safety is important to [place]. If you have drug allergies check your prescription carefully.   For the next 24 hours you can use MyChart to ask questions about today's visit, request a non-urgent call back, or ask for a work or school excuse. You will get an email in the next two days asking about your experience. I hope that your e-visit has been valuable and will speed your recovery.   I have spent 5 minutes in review of e-visit questionnaire, review and updating patient chart, medical decision making and response to patient.   Lenise Arena Ward, PA-C

## 2023-03-22 ENCOUNTER — Encounter: Payer: Self-pay | Admitting: Internal Medicine

## 2023-03-22 ENCOUNTER — Ambulatory Visit: Payer: BC Managed Care – PPO | Admitting: Internal Medicine

## 2023-03-22 VITALS — BP 120/72 | HR 87 | Ht 67.0 in | Wt 217.0 lb

## 2023-03-22 DIAGNOSIS — E119 Type 2 diabetes mellitus without complications: Secondary | ICD-10-CM | POA: Diagnosis not present

## 2023-03-22 DIAGNOSIS — Z7984 Long term (current) use of oral hypoglycemic drugs: Secondary | ICD-10-CM

## 2023-03-22 DIAGNOSIS — E785 Hyperlipidemia, unspecified: Secondary | ICD-10-CM | POA: Diagnosis not present

## 2023-03-22 LAB — LIPID PANEL
Cholesterol: 177 mg/dL (ref 0–200)
HDL: 51.6 mg/dL (ref >39.00)
LDL Cholesterol: 110 mg/dL — ABNORMAL HIGH (ref 0–99)
NonHDL: 125.66
Total CHOL/HDL Ratio: 3
Triglycerides: 79 mg/dL (ref 0.0–149.0)
VLDL: 15.8 mg/dL (ref 0.0–40.0)

## 2023-03-22 LAB — T4, FREE: Free T4: 0.99 ng/dL (ref 0.60–1.60)

## 2023-03-22 LAB — POCT GLYCOSYLATED HEMOGLOBIN (HGB A1C): Hemoglobin A1C: 5.7 % — AB (ref 4.0–5.6)

## 2023-03-22 LAB — MICROALBUMIN / CREATININE URINE RATIO
Creatinine,U: 138.5 mg/dL
Microalb Creat Ratio: 0.5 mg/g (ref 0.0–30.0)
Microalb, Ur: <0.7 mg/dL (ref 0.0–1.9)

## 2023-03-22 LAB — COMPREHENSIVE METABOLIC PANEL WITH GFR
ALT: 12 U/L (ref 0–35)
AST: 16 U/L (ref 0–37)
Albumin: 4.5 g/dL (ref 3.5–5.2)
Alkaline Phosphatase: 52 U/L (ref 39–117)
BUN: 18 mg/dL (ref 6–23)
CO2: 27 meq/L (ref 19–32)
Calcium: 9.8 mg/dL (ref 8.4–10.5)
Chloride: 102 meq/L (ref 96–112)
Creatinine, Ser: 0.77 mg/dL (ref 0.40–1.20)
GFR: 86.69 mL/min
Glucose, Bld: 116 mg/dL — ABNORMAL HIGH (ref 70–99)
Potassium: 3.8 meq/L (ref 3.5–5.1)
Sodium: 140 meq/L (ref 135–145)
Total Bilirubin: 0.9 mg/dL (ref 0.2–1.2)
Total Protein: 7.3 g/dL (ref 6.0–8.3)

## 2023-03-22 LAB — HM DIABETES FOOT EXAM: A1c: 5.7

## 2023-03-22 LAB — TSH: TSH: 1.47 u[IU]/mL (ref 0.35–5.50)

## 2023-03-22 MED ORDER — METFORMIN HCL ER 750 MG PO TB24: 750.0000 mg | ORAL_TABLET | Freq: Every day | ORAL | 3 refills | Status: DC

## 2023-03-22 MED ORDER — RYBELSUS 7 MG PO TABS: 1.0000 | ORAL_TABLET | Freq: Every day | ORAL | 3 refills | Status: DC

## 2023-03-22 NOTE — Progress Notes (Unsigned)
Name: Debbie Hunt  Age/ Sex: 57 y.o., female   MRN/ DOB: [id number], [birth year]     PCP: Debbie Ip, DO   Reason for Endocrinology Evaluation: Type 2 Diabetes Mellitus  Initial Endocrine Consultative Visit: 09/10/2020    PATIENT IDENTIFIER: Debbie Hunt is a 57 y.o. female with a past medical history of T2DM and Dyslipidemia . The patient has followed with Endocrinology clinic since 09/10/2020 for consultative assistance with management of her diabetes.  DIABETIC HISTORY:  Debbie Hunt was diagnosed with gestational diabetes in ~1995 then was diagnosed with T2DM in 2007. Her hemoglobin A1c has ranged from 7.0% in 2019, peaking at 10.3% in 2021.  On her initial visit to our clinic she had an A1c of 8.1 %. She was on Farxiga and Metformin, we stopped [name] due to recurrent yeast infection hx of about 6 months . Continue Metformin , started Glipizide and Rybelsus    Glipizide stopped 11/2020 with an A1c 6.0%     Father with hx of thyroid disease   SUBJECTIVE:   During the last visit (09/20/2022): A1c 6.0%      Today (03/22/2023): Debbie Hunt is here for a follow up on diabetes management.  She checks her blood sugars 1 times daily. The patient has not had hypoglycemic episodes since the last clinic visit.  Pt continues with weight loss  Denies nausea except last week  that she attributes to possible anxiety but no  vomiting or diarrhea   HOME DIABETES REGIMEN:  Metformin 1000 mg a day - BID  Rybelsus 7 mg daily      Statin: no ACE-I/ARB: yes    METER DOWNLOAD SUMMARY: unable to download  95- 166  mg/dL   DIABETIC COMPLICATIONS: Microvascular complications:   Denies: CKD, neuropathy, retinopathy Last Eye Exam: Completed 03/24/2022  Macrovascular complications:   Denies: CAD, CVA, PVD   HISTORY:  Past Medical History:  Past Medical History:  Diagnosis Date   Diabetes mellitus without complication (HCC)    Hyperlipidemia     Hypertension    Past Surgical History:  Past Surgical History:  Procedure Laterality Date   albaltion     CHOLECYSTECTOMY     Social History:  reports that she has never smoked. She has never used smokeless tobacco. She reports that she does not drink alcohol and does not use drugs. Family History:  Family History  Problem Relation Age of Onset   Diabetes Mother    Hypertension Mother      HOME MEDICATIONS: Allergies as of 03/22/2023       Reactions   Codeine    Penicillins Hives        Medication List        Accurate as of Mar 22, 2023  7:52 AM. If you have any questions, ask your nurse or doctor.          STOP taking these medications    metFORMIN 1000 MG tablet Commonly known as: GLUCOPHAGE Replaced by: metFORMIN 750 MG 24 hr tablet Stopped by: Scarlette Shorts, MD       TAKE these medications    Accu-Chek Guide test strip Generic drug: glucose blood Check BGs once daily E11.9   Accu-Chek Softclix Lancets lancets Use as instructed to check BGs daily. E11.9   acyclovir ointment 5 % Commonly known as: Zovirax Apply 1 Application topically every 6 (six) hours as needed. As directed as needed for cold sores   hydrocortisone 25 MG suppository Commonly known as: ANUSOL-HC  Place 1 suppository (25 mg total) rectally 2 (two) times daily as needed for hemorrhoids.   lisinopril-hydrochlorothiazide 20-12.5 MG tablet Commonly known as: ZESTORETIC Take 1 tablet by mouth daily.   metFORMIN 750 MG 24 hr tablet Commonly known as: GLUCOPHAGE-XR Take 1 tablet (750 mg total) by mouth daily. Replaces: metFORMIN 1000 MG tablet Started by: Scarlette Shorts, MD   Rybelsus 7 MG Tabs Generic drug: Semaglutide Take 1 tablet (7 mg total) by mouth daily before breakfast. What changed: how much to take Changed by: Scarlette Shorts, MD         OBJECTIVE:   Vital Signs: BP 120/72 (BP Location: Left Arm, Patient Position: Sitting, Cuff Size: Large)    Pulse 87   Ht 5\' 7"  (1.702 m)   Wt 217 lb (98.4 kg)   LMP 09/26/2014   SpO2 97%   BMI 33.99 kg/m   Wt Readings from Last 3 Encounters:  03/22/23 217 lb (98.4 kg)  09/20/22 223 lb (101.2 kg)  04/26/22 228 lb 12.8 oz (103.8 kg)     Exam: General: Pt appears well and is in NAD  Lungs: Clear with good BS bilat with no rales, rhonchi, or wheezes  Heart: RRR   Abdomen: Normoactive bowel sounds, soft, nontender, without masses or organomegaly palpable  Extremities: No pretibial edema.   Neuro: MS is good with appropriate affect, pt is alert and Ox3    DM foot exam: 03/22/2023    The skin of the feet is intact without sores or ulcerations. The pedal pulses are 2+ on right and 2+ on left. The sensation is intact to a screening 5.07, 10 gram monofilament bilaterally     DATA REVIEWED:  Lab Results  Component Value Date   HGBA1C 5.7 (A) 03/22/2023   HGBA1C 6.0 (A) 09/20/2022   HGBA1C 6.2 (A) 03/23/2022    Latest Reference Range & Units 03/22/23 07:57  Sodium 135 - 145 mEq/L 140  Potassium 3.5 - 5.1 mEq/L 3.8  Chloride 96 - 112 mEq/L 102  CO2 19 - 32 mEq/L 27  Glucose 70 - 99 mg/dL 981 (H)  BUN 6 - 23 mg/dL 18  Creatinine 1.91 - 4.78 mg/dL 2.95  Calcium 8.4 - 62.1 mg/dL 9.8  Alkaline Phosphatase 39 - 117 U/L 52  Albumin 3.5 - 5.2 g/dL 4.5  AST 0 - 37 U/L 16  ALT 0 - 35 U/L 12  Total Protein 6.0 - 8.3 g/dL 7.3  Total Bilirubin 0.2 - 1.2 mg/dL 0.9  GFR >30.86 mL/min 86.69    Latest Reference Range & Units 03/22/23 07:57  Total CHOL/HDL Ratio  3  Cholesterol 0 - 200 mg/dL 578  HDL Cholesterol >46.96 mg/dL 29.52  LDL (calc) 0 - 99 mg/dL 841 (H)  MICROALB/CREAT RATIO 0.0 - 30.0 mg/g 0.5  NonHDL  125.66  Triglycerides 0.0 - 149.0 mg/dL 32.4  VLDL 0.0 - 40.1 mg/dL 02.7    Latest Reference Range & Units 03/22/23 07:57  TSH 0.35 - 5.50 uIU/mL 1.47  T4,Free(Direct) 0.60 - 1.60 ng/dL 2.53  Creatinine,U mg/dL 664.4  Microalb, Ur 0.0 - 1.9 mg/dL <0.3  MICROALB/CREAT RATIO  0.0 - 30.0 mg/g 0.5     ASSESSMENT / PLAN / RECOMMENDATIONS:   1) Type 2 Diabetes Mellitus, Optimally controlled, Without complications - Most recent A1c of 5.7 %. Goal A1c < 7.0 %.    - A1c continues to be optimal  - I had reduced her Metformin from 2000 mg to 1000 mg but she had to  return to the higher dose due to hyperglycemia, we discussed switching to XR formulation with a smaller dose as below  - Intolerant to [place] due to recurrent genital infections  MEDICATIONS:  - Change  Metformin 750 mg XR BID  - Continue Rybelsus 7 mg, 1 tablet before Breakfast   EDUCATION / INSTRUCTIONS: BG monitoring instructions: Patient is instructed to check her blood sugars 1 times a day, fasting. Call [facility] if: BG persistently < 70 I reviewed the Rule of 15 for the treatment of hypoglycemia in detail with the patient. Literature supplied.     2) Diabetic complications:  Eye: Does not have known diabetic retinopathy.  Neuro/ Feet: Does not have known diabetic peripheral neuropathy .  Renal: Patient does not have known baseline CKD. She   is  on an ACEI/ARB at present.    3) Dyslipidemia:  -She has never been on statin therapy in the past -LDL is above goal -Will encourage lifestyle changes with low-fat diet, if this remains elevated, we will consider statin therapy   F/U in 6 months     Signed electronically by: Lyndle Herrlich, MD  [facility] Endocrin[facility]  [facility] [address] [place] [place], [place] [address] Phone: [phone] FAX: [phone]   CC: Debbie Ip, DO [address] [place] [address] Phone: [phone]  Fax: [phone]  Return to Endocrinology clinic as below: No future appointments.

## 2023-03-22 NOTE — Patient Instructions (Signed)
-   Change  Metformin 750 mg twice daily  - Continue Rybelsus 7 mg, 1 tablet before Breakfast     HOW TO TREAT LOW BLOOD SUGARS (Blood sugar LESS THAN 70 MG/DL) Please follow the RULE OF 15 for the treatment of hypoglycemia treatment (when your (blood sugars are less than 70 mg/dL)   STEP 1: Take 15 grams of carbohydrates when your blood sugar is low, which includes:  3-4 GLUCOSE TABS  OR 3-4 OZ OF JUICE OR REGULAR SODA OR ONE TUBE OF GLUCOSE GEL    STEP 2: RECHECK blood sugar in 15 MINUTES STEP 3: If your blood sugar is still low at the 15 minute recheck --> then, go back to STEP 1 and treat AGAIN with another 15 grams of carbohydrates.

## 2023-03-24 ENCOUNTER — Encounter: Payer: Self-pay | Admitting: Internal Medicine

## 2023-03-24 IMAGING — DX DG ABDOMEN 1V
2 series · 2 of 2 positions shown · non-contrast
Comparison: None.

CLINICAL DATA: Right lower quadrant abdominal pain.

EXAM:
ABDOMEN - 1 VIEW

[abdomen kub (1 of 2)]
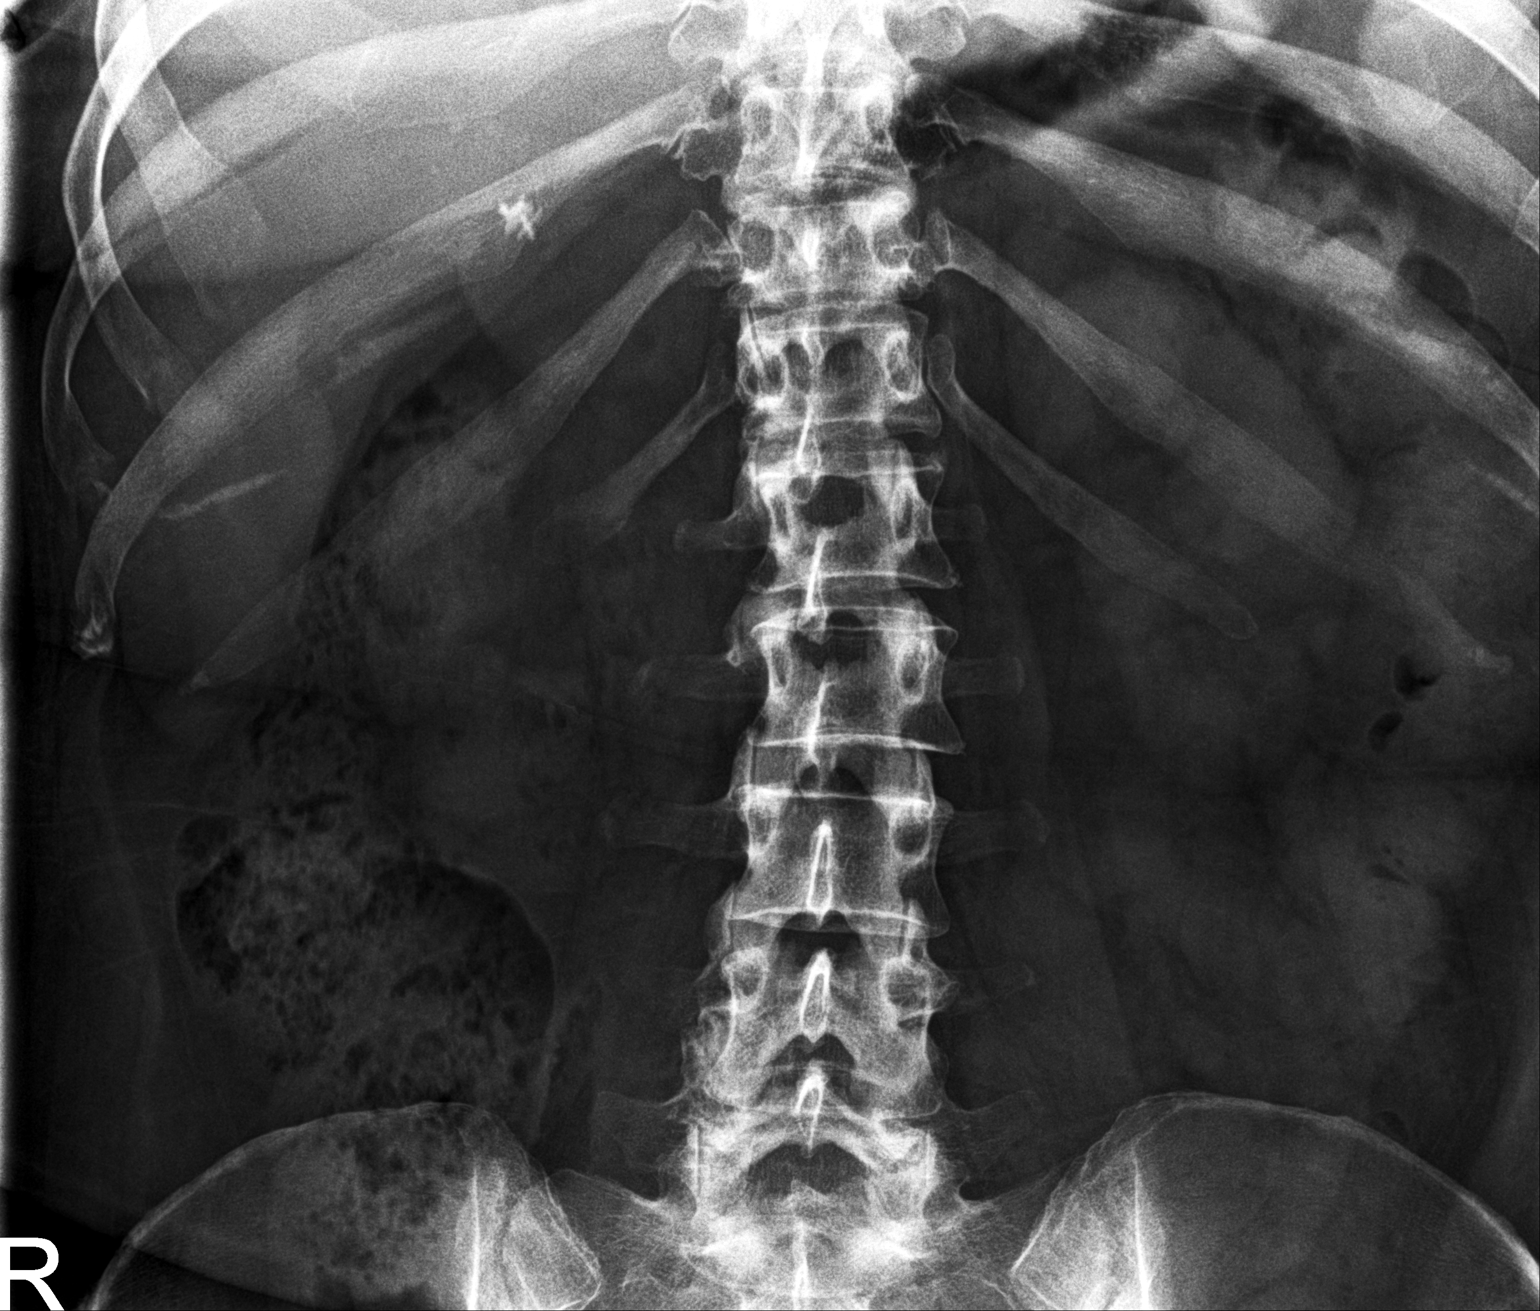

[abdomen kub (2 of 2)]
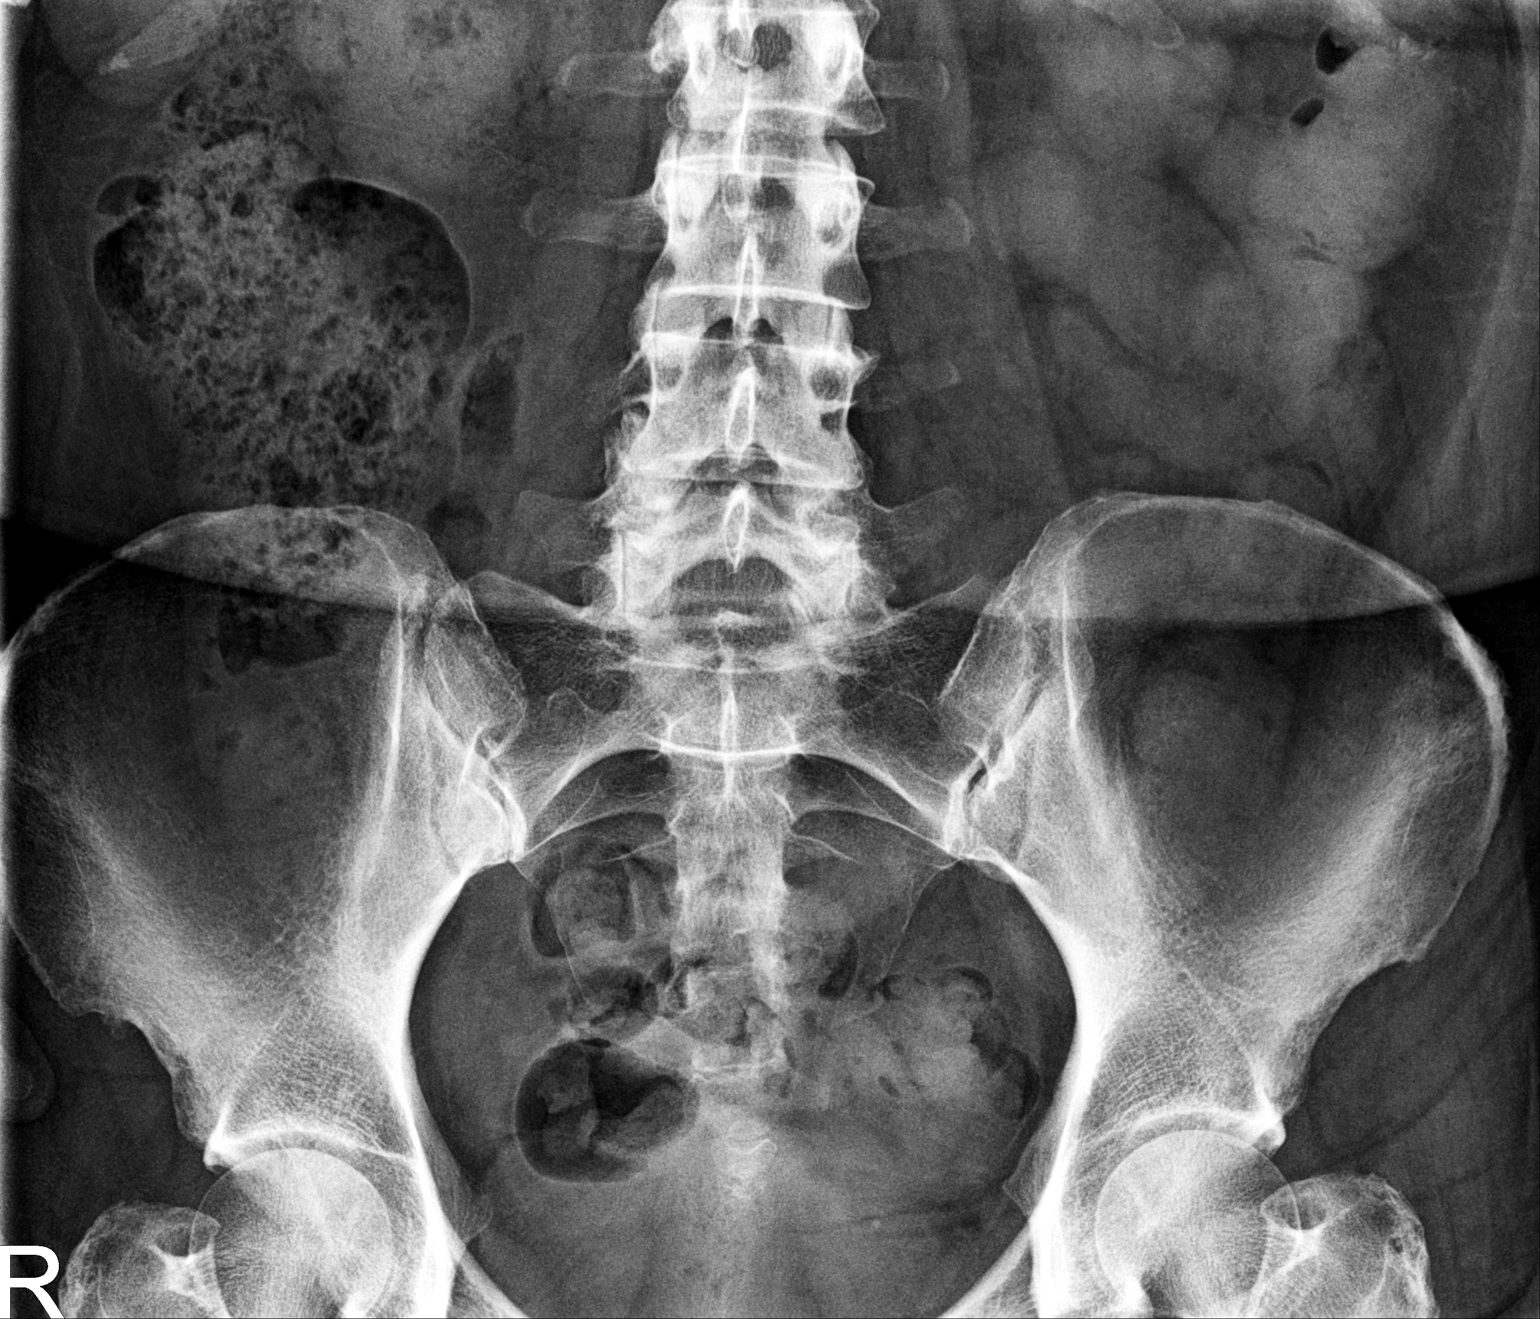

[2 of 2 positions shown; findings below may reference images not displayed]

FINDINGS: The bowel gas pattern is normal. No radio-opaque calculi or other
significant radiographic abnormality are seen. The patient is status
post cholecystectomy.
IMPRESSION: No acute abnormality or finding to explain the patient's symptoms.

## 2023-04-16 ENCOUNTER — Other Ambulatory Visit: Payer: Self-pay | Admitting: Family Medicine

## 2023-04-16 DIAGNOSIS — I152 Hypertension secondary to endocrine disorders: Secondary | ICD-10-CM

## 2023-04-20 ENCOUNTER — Telehealth: Payer: BC Managed Care – PPO | Admitting: Physician Assistant

## 2023-04-20 DIAGNOSIS — J020 Streptococcal pharyngitis: Secondary | ICD-10-CM | POA: Diagnosis not present

## 2023-04-20 MED ORDER — AZITHROMYCIN 250 MG PO TABS
ORAL_TABLET | ORAL | 0 refills | Status: AC
Start: 2023-04-20 — End: 2023-04-25

## 2023-04-20 NOTE — Progress Notes (Signed)

## 2023-04-20 NOTE — Progress Notes (Signed)
I have spent 5 minutes in review of e-visit questionnaire, review and updating patient chart, medical decision making and response to patient.   Keats Kingry Cody Davison Ohms, PA-C    

## 2023-05-06 ENCOUNTER — Other Ambulatory Visit: Payer: Self-pay | Admitting: Internal Medicine

## 2023-05-06 DIAGNOSIS — E119 Type 2 diabetes mellitus without complications: Secondary | ICD-10-CM

## 2023-05-17 ENCOUNTER — Ambulatory Visit: Payer: BC Managed Care – PPO | Admitting: Family Medicine

## 2023-05-17 ENCOUNTER — Encounter: Payer: Self-pay | Admitting: Family Medicine

## 2023-05-17 VITALS — BP 119/77 | HR 69 | Temp 97.6°F | Resp 20 | Ht 67.0 in | Wt 226.0 lb

## 2023-05-17 DIAGNOSIS — R21 Rash and other nonspecific skin eruption: Secondary | ICD-10-CM | POA: Diagnosis not present

## 2023-05-17 MED ORDER — TRIAMCINOLONE ACETONIDE 0.5 % EX OINT
1.0000 | TOPICAL_OINTMENT | Freq: Two times a day (BID) | CUTANEOUS | 0 refills | Status: DC
Start: 2023-05-17 — End: 2024-07-12

## 2023-05-17 NOTE — Progress Notes (Signed)
   Acute Office Visit  Subjective:     Patient ID: Debbie Hunt, female    DOB: 1965/12/05, 57 y.o.   MRN: [id number]  Chief Complaint  Patient presents with   Red spots on back    HPI Patient is in today for a rash on her upper back. She isn't sure how long this has been present. She scratched her back on a floating shelf and when she looked in the mirror, she noticed flat pink areas on the middle of her mid to upper back. She gets monthly massages and has one a few weeks ago. She believes her therapist would've mentioned the rash if it had been present. The rash does not bother her. She denies itching, exudate, warmth, fever, drainage. She has not tried any remedies. There is a family hx of eczema. She denies few products or foods.  ROS As per HPI.      Objective:    BP 119/77   Pulse 69   Temp 97.6 F (36.4 C) (Temporal)   Resp 20   Ht 5\' 7"  (1.702 m)   Wt 226 lb (102.5 kg)   LMP 09/26/2014   SpO2 99%   BMI 35.40 kg/m    Physical Exam Vitals and nursing note reviewed.  Constitutional:      General: She is not in acute distress.    Appearance: Normal appearance. She is not ill-appearing, toxic-appearing or diaphoretic.  Pulmonary:     Effort: Pulmonary effort is normal. No respiratory distress.  Musculoskeletal:     Right lower leg: No edema.     Left lower leg: No edema.  Skin:    General: Skin is warm and dry.     Comments: Flat pink area scatter to mid upper back with mild scaling present. No warmth, exudate, or vesicles present.   Neurological:     General: No focal deficit present.     Mental Status: She is alert and oriented to person, place, and time.  Psychiatric:        Mood and Affect: Mood normal.        Behavior: Behavior normal.     No results found for any visits on 05/17/23.      Assessment & Plan:   Debbie Hunt was seen today for red spots on back.  Diagnoses and all orders for this visit:  Rash of back ? Eczema. Kenalog as below. Return  to office for new or worsening symptoms, or if symptoms persist.  -     triamcinolone ointment (KENALOG) 0.5 %; Apply 1 Application topically 2 (two) times daily.   The patient indicates understanding of these issues and agrees with the plan.   Gabriel Earing, FNP

## 2023-05-22 ENCOUNTER — Encounter: Payer: Self-pay | Admitting: Internal Medicine

## 2023-07-05 DIAGNOSIS — D225 Melanocytic nevi of trunk: Secondary | ICD-10-CM | POA: Diagnosis not present

## 2023-07-05 DIAGNOSIS — B360 Pityriasis versicolor: Secondary | ICD-10-CM | POA: Diagnosis not present

## 2023-07-10 ENCOUNTER — Ambulatory Visit: Payer: BC Managed Care – PPO | Admitting: Family Medicine

## 2023-07-10 VITALS — BP 116/75 | HR 65 | Temp 98.5°F | Ht 67.0 in | Wt 233.0 lb

## 2023-07-10 DIAGNOSIS — M76822 Posterior tibial tendinitis, left leg: Secondary | ICD-10-CM

## 2023-07-10 DIAGNOSIS — I152 Hypertension secondary to endocrine disorders: Secondary | ICD-10-CM

## 2023-07-10 DIAGNOSIS — E1159 Type 2 diabetes mellitus with other circulatory complications: Secondary | ICD-10-CM

## 2023-07-10 DIAGNOSIS — R635 Abnormal weight gain: Secondary | ICD-10-CM | POA: Diagnosis not present

## 2023-07-10 DIAGNOSIS — Z7984 Long term (current) use of oral hypoglycemic drugs: Secondary | ICD-10-CM

## 2023-07-10 DIAGNOSIS — E1165 Type 2 diabetes mellitus with hyperglycemia: Secondary | ICD-10-CM

## 2023-07-10 MED ORDER — LISINOPRIL-HYDROCHLOROTHIAZIDE 20-12.5 MG PO TABS
1.0000 | ORAL_TABLET | Freq: Every day | ORAL | 3 refills | Status: DC
Start: 2023-07-10 — End: 2024-04-30

## 2023-07-10 MED ORDER — RYBELSUS 14 MG PO TABS
14.0000 mg | ORAL_TABLET | Freq: Every day | ORAL | 3 refills | Status: DC
Start: 2023-07-10 — End: 2024-02-23

## 2023-07-10 NOTE — Progress Notes (Signed)
Subjective: CC: T2DM PCP: Raliegh Ip, DO Debbie Hunt is a 57 y.o. female presenting to clinic today for:  1. HTN associated with T2DM Patient under the care of endocrinology.  She had her metformin recently switched from IR to ER and was continued on Rybelsus 7 mg daily along with Zestoretic.  She is not on a statin.  She reports that since the med change with metformin she all of a sudden has been gaining weight and reports a 15 pound weight gain since that visit.  She denies any changes in exercise habits or food intake.  She is quite concerned over the weight gain.  Denies any swelling, chest pain, shortness of breath  2.  Medial ankle pain Patient reports couple week history of left-sided medial ankle and foot pain.  She points to the lower medial ankle as a source of pain.  Denies any preceding injury.  She reports some swelling and tried to use a brace yesterday but really caused more pain than it helped.  She has not utilized many OTC analgesics but does have some on hand.  She has tried icing the area.  She admits that she does not wear supportive footwear.  She has congenital flatfoot.  Does not really like utilizing tennis shoes.   ROS: Per HPI  Allergies  Allergen Reactions   Codeine    Penicillins Hives   Past Medical History:  Diagnosis Date   Diabetes mellitus without complication (HCC)    Hyperlipidemia    Hypertension     Current Outpatient Medications:    Accu-Chek Softclix Lancets lancets, Use as instructed to check BGs daily. E11.9, Disp: 100 each, Rfl: 12   acyclovir ointment (ZOVIRAX) 5 %, Apply 1 Application topically every 6 (six) hours as needed. As directed as needed for cold sores, Disp: 30 g, Rfl: 0   glucose blood (ACCU-CHEK GUIDE) test strip, Check BGs once daily E11.9, Disp: 100 strip, Rfl: 3   lisinopril-hydrochlorothiazide (ZESTORETIC) 20-12.5 MG tablet, Take 1 tablet by mouth daily., Disp: 90 tablet, Rfl: 3   metFORMIN  (GLUCOPHAGE-XR) 750 MG 24 hr tablet, Take 1 tablet (750 mg total) by mouth daily., Disp: 180 tablet, Rfl: 3   Semaglutide (RYBELSUS) 7 MG TABS, Take 1 tablet (7 mg total) by mouth daily before breakfast., Disp: 90 tablet, Rfl: 3   triamcinolone ointment (KENALOG) 0.5 %, Apply 1 Application topically 2 (two) times daily., Disp: 30 g, Rfl: 0 Social History   Socioeconomic History   Marital status: Married    Spouse name: Not on file   Number of children: Not on file   Years of education: Not on file   Highest education level: Some college, no degree  Occupational History   Not on file  Tobacco Use   Smoking status: Never   Smokeless tobacco: Never  Vaping Use   Vaping status: Never Used  Substance and Sexual Activity   Alcohol use: No   Drug use: No   Sexual activity: Yes  Other Topics Concern   Not on file  Social History Narrative   Not on file   Social Determinants of Health   Financial Resource Strain: Low Risk  (07/10/2023)   Overall Financial Resource Strain (CARDIA)    Difficulty of Paying Living Expenses: Not hard at all  Food Insecurity: No Food Insecurity (07/10/2023)   Hunger Vital Sign    Worried About Running Out of Food in the Last Year: Never true    Ran Out of  Food in the Last Year: Never true  Transportation Needs: No Transportation Needs (07/10/2023)   PRAPARE - Administrator, Civil Service (Medical): No    Lack of Transportation (Non-Medical): No  Physical Activity: Insufficiently Active (07/10/2023)   Exercise Vital Sign    Days of Exercise per Week: 4 days    Minutes of Exercise per Session: 30 min  Stress: No Stress Concern Present (07/10/2023)   [employer] of Occupational Health - Occupational Stress Questionnaire    Feeling of Stress : Only a little  Social Connections: Unknown (07/10/2023)   Social Connection and Isolation Panel [NHANES]    Frequency of Communication with Friends and Family: More than three times a week     Frequency of Social Gatherings with Friends and Family: Once a week    Attends Religious Services: Patient declined    Database administrator or Organizations: No    Attends Engineer, structural: Not on file    Marital Status: Married  Catering manager Violence: Not on file   Family History  Problem Relation Age of Onset   Diabetes Mother    Hypertension Mother     Objective: Office vital signs reviewed. BP 116/75   Pulse 65   Temp 98.5 F (36.9 C)   Ht 5\' 7"  (1.702 m)   Wt 233 lb (105.7 kg)   LMP 09/26/2014   SpO2 99%   BMI 36.49 kg/m   Physical Examination:  General: Awake, alert, obese, No acute distress HEENT: sclera white, MMM Cardio: regular rate and rhythm, S1S2 heard, no murmurs appreciated Pulm: clear to auscultation bilaterally, no wheezes, rhonchi or rales; normal work of breathing on room air Extremities: warm, well perfused, No edema, cyanosis or clubbing; +2 pulses bilaterally MSK: Independent, normal gait and station.  She has visible flatfoot bilaterally.  Mild soft tissue swelling noted along the medial arch extending into the distal medial malleolus.  She is tender along this area.  Has full plantar and dorsiflexion of the foot.  No gross erythema, induration or discoloration.  Assessment/ Plan: 57 y.o. female   Hypertension associated with diabetes (HCC) - Plan: lisinopril-hydrochlorothiazide (ZESTORETIC) 20-12.5 MG tablet  Type 2 diabetes mellitus with hyperglycemia, without long-term current use of insulin (HCC) - Plan: Semaglutide (RYBELSUS) 14 MG TABS  Posterior tibial tendinitis of left lower extremity  Weight gain  Blood pressure well-controlled.  Renewal of medications has been sent.  I was able to see her lab results from endocrinology recently.  These were not repeated today  I have advanced her Rybelsus to 14 mg given reports of weight gain and elevations in blood sugar.  Not sure why switching from IR to XR would suddenly cause  this weight change but we will see if we can modify regimen in efforts to alleviate.  I will CC endocrinology as FYI as well  Suspect posterior tibial tendinitis.  We discussed this may be a combination of weight gain, congenital flatfoot and nonsupportive footwear.  Discussed utilization of supportive footwear, oral NSAIDs and ice to the affected area.  If ongoing despite conservative treatment, recommend evaluation with podiatry for consideration of orthotics and/or injection.   Raliegh Ip, DO [facility] [phone]

## 2023-07-10 NOTE — Patient Instructions (Signed)
Posterior Tibial Tendinitis Posterior tibial tendinitis is irritation of a tendon called the posterior tibial tendon. Your posterior tibial tendon is a cord-like tissue that connects calf muscles to your foot. They work together to: Support your arch. Help you rise up on your toes. Help you turn your foot down and in (inversion). This condition causes foot and ankle pain. It can also lead to a flat foot. What are the causes? This condition is most often caused by repeated stress to the tendon (overuse injury). It can also be caused by a sudden injury that stresses the tendon, such as landing on your foot after jumping or falling. What increases the risk? This condition is more likely to develop in: People who play a sport that involves putting a lot of pressure on the feet, such as basketball, tennis, soccer, and hockey. Runners. Females who are older than 56 years of age and are overweight. People with diabetes. People with decreased foot stability. People with flat feet. What are the signs or symptoms? Symptoms include: Pain in the inner ankle. Pain at the arch of your foot. Pain that gets worse with running, walking, or standing. Swelling on the inside of your ankle and foot. Weakness in your ankle or foot. Inability to stand up on tiptoe. Flattening of the arch of your foot. How is this diagnosed? This condition may be diagnosed based on: Your symptoms. Your medical history. A physical exam. Tests, such as X-ray, MRI, or ultrasound. How is this treated? This condition may be treated by: Putting ice on the injured area. Taking NSAIDs, such as ibuprofen, to reduce pain and swelling. Wearing a special shoe or shoe insert to support your arch (orthotic). Having physical therapy. Replacing high-impact exercise with low-impact exercise, such as swimming or cycling. If your symptoms do not improve with these treatments, you may need to wear a splint, removable walking boot, or  short leg cast for 6-8 weeks to keep your foot and ankle still (immobilized). Follow these instructions at home: If you have a nonremovable cast or splint: Do not put pressure on any part of the cast or splint until it is fully hardened. This may take several hours. Do not stick anything inside the cast or splint to scratch your skin. Doing that increases your risk of infection. Check the skin around the cast or splint every day. Tell your health care provider about any concerns. You may put lotion on dry skin around the edges of the cast or splint. Do not put lotion on the skin underneath it. Keep the cast or splint clean and dry. If you have a removable boot: Wear the boot as told by your provider. Remove it only as told by your provider. Check the skin around the boot every day. Tell your provider about any concerns. Loosen the boot if your toes tingle, become numb, or turn cold and blue. Keep the boot clean and dry. Bathing Do not take baths, swim, or use a hot tub until your provider approves. Ask your provider if you may take showers. If your cast, splint, or boot is not waterproof: Do not let it get wet. Cover it with a waterproof covering while you take a bath or a shower. Managing pain and swelling  If told, put ice on the injured area. If you have a removable boot, remove it as told by your provider. Put ice in a plastic bag. Place a towel between your skin and the bag or between your cast and the bag.   Leave the ice on for 20 minutes, 2-3 times a day. If your skin turns bright red, remove the ice right away to prevent skin damage. The risk of damage is higher if you cannot feel pain, heat, or cold. Move your toes often to reduce stiffness and swelling. Raise (elevate) the injured area above the level of your heart while you are sitting or lying down. Activity Do not use the injured foot to support your body weight until your provider says that you can. Use crutches as told by  your provider. Do not do activities that make pain or swelling worse. Ask your provider when it is safe to drive if you have a cast, splint, or boot on your foot. Do exercises as told by your provider. Return to your normal activities as told by your provider. Ask your provider what activities are safe for you. General instructions Take over-the-counter and prescription medicines only as told by your provider. If you have an orthotic, use it as told by your provider. How is this prevented? Wear footwear that is appropriate for your athletic activity. Avoid athletic activities that cause pain or swelling in your ankle or foot. Warm up and stretch before being active. Stop activity if you develop pain or swelling while training. See your provider if you have pain or swelling that does not improve after a few days of rest. Start a new athletic activity slowly so you can build up your strength and flexibility. Contact a health care provider if: Your symptoms get worse. Your symptoms do not improve in 6-8 weeks. You develop new, unexplained symptoms. Your splint, boot, or cast gets damaged. This information is not intended to replace advice given to you by your health care provider. Make sure you discuss any questions you have with your health care provider. Document Revised: 11/22/2022 Document Reviewed: 11/22/2022 Elsevier Patient Education  2024 Elsevier Inc.  

## 2023-08-23 DIAGNOSIS — L308 Other specified dermatitis: Secondary | ICD-10-CM | POA: Diagnosis not present

## 2023-09-21 DIAGNOSIS — Z01419 Encounter for gynecological examination (general) (routine) without abnormal findings: Secondary | ICD-10-CM | POA: Diagnosis not present

## 2023-09-21 DIAGNOSIS — Z1231 Encounter for screening mammogram for malignant neoplasm of breast: Secondary | ICD-10-CM | POA: Diagnosis not present

## 2023-09-22 ENCOUNTER — Telehealth: Payer: BC Managed Care – PPO | Admitting: Physician Assistant

## 2023-09-22 DIAGNOSIS — B9689 Other specified bacterial agents as the cause of diseases classified elsewhere: Secondary | ICD-10-CM | POA: Diagnosis not present

## 2023-09-22 DIAGNOSIS — J019 Acute sinusitis, unspecified: Secondary | ICD-10-CM | POA: Diagnosis not present

## 2023-09-22 MED ORDER — DOXYCYCLINE HYCLATE 100 MG PO TABS
100.0000 mg | ORAL_TABLET | Freq: Two times a day (BID) | ORAL | 0 refills | Status: DC
Start: 2023-09-22 — End: 2023-10-06

## 2023-09-22 NOTE — Progress Notes (Signed)

## 2023-09-25 ENCOUNTER — Ambulatory Visit: Payer: BC Managed Care – PPO | Admitting: Internal Medicine

## 2023-09-25 DIAGNOSIS — U071 COVID-19: Secondary | ICD-10-CM | POA: Diagnosis not present

## 2023-09-25 DIAGNOSIS — H109 Unspecified conjunctivitis: Secondary | ICD-10-CM | POA: Diagnosis not present

## 2023-09-25 DIAGNOSIS — Z20822 Contact with and (suspected) exposure to covid-19: Secondary | ICD-10-CM | POA: Diagnosis not present

## 2023-09-25 DIAGNOSIS — R070 Pain in throat: Secondary | ICD-10-CM | POA: Diagnosis not present

## 2023-09-28 ENCOUNTER — Encounter: Payer: Self-pay | Admitting: Family Medicine

## 2023-10-06 ENCOUNTER — Encounter: Payer: Self-pay | Admitting: Internal Medicine

## 2023-10-06 ENCOUNTER — Ambulatory Visit: Payer: BC Managed Care – PPO | Admitting: Internal Medicine

## 2023-10-06 VITALS — BP 122/80 | HR 54 | Ht 67.0 in | Wt 229.0 lb

## 2023-10-06 DIAGNOSIS — E119 Type 2 diabetes mellitus without complications: Secondary | ICD-10-CM

## 2023-10-06 DIAGNOSIS — E785 Hyperlipidemia, unspecified: Secondary | ICD-10-CM | POA: Diagnosis not present

## 2023-10-06 DIAGNOSIS — Z7984 Long term (current) use of oral hypoglycemic drugs: Secondary | ICD-10-CM | POA: Diagnosis not present

## 2023-10-06 LAB — POCT GLYCOSYLATED HEMOGLOBIN (HGB A1C): Hemoglobin A1C: 6.1 % — AB (ref 4.0–5.6)

## 2023-10-06 LAB — LIPID PANEL
Cholesterol: 181 mg/dL (ref 0–200)
HDL: 45.6 mg/dL (ref >39.00)
LDL Cholesterol: 118 mg/dL — ABNORMAL HIGH (ref 0–99)
NonHDL: 135.42
Total CHOL/HDL Ratio: 4
Triglycerides: 86 mg/dL (ref 0.0–149.0)
VLDL: 17.2 mg/dL (ref 0.0–40.0)

## 2023-10-06 LAB — VITAMIN D 25 HYDROXY (VIT D DEFICIENCY, FRACTURES): VITD: 35.43 ng/mL (ref 30.00–100.00)

## 2023-10-06 LAB — COMPREHENSIVE METABOLIC PANEL
ALT: 12 U/L (ref 0–35)
AST: 17 U/L (ref 0–37)
Albumin: 4.4 g/dL (ref 3.5–5.2)
Alkaline Phosphatase: 62 U/L (ref 39–117)
BUN: 16 mg/dL (ref 6–23)
CO2: 28 meq/L (ref 19–32)
Calcium: 9.5 mg/dL (ref 8.4–10.5)
Chloride: 103 meq/L (ref 96–112)
Creatinine, Ser: 0.78 mg/dL (ref 0.40–1.20)
GFR: 85.04 mL/min (ref >60.00)
Glucose, Bld: 109 mg/dL — ABNORMAL HIGH (ref 70–99)
Potassium: 4 meq/L (ref 3.5–5.1)
Sodium: 140 meq/L (ref 135–145)
Total Bilirubin: 0.8 mg/dL (ref 0.2–1.2)
Total Protein: 7.3 g/dL (ref 6.0–8.3)

## 2023-10-06 MED ORDER — ROSUVASTATIN CALCIUM 5 MG PO TABS: 5.0000 mg | ORAL_TABLET | Freq: Every day | ORAL | 3 refills | Status: DC

## 2023-10-06 NOTE — Patient Instructions (Addendum)
-   Continue  Metformin 750 mg daily  - Continue Rybelsus 7 mg, 1 tablet before Breakfast   Check out  Ozempic , Mounjaro     HOW TO TREAT LOW BLOOD SUGARS (Blood sugar LESS THAN 70 MG/DL) Please follow the RULE OF 15 for the treatment of hypoglycemia treatment (when your (blood sugars are less than 70 mg/dL)   STEP 1: Take 15 grams of carbohydrates when your blood sugar is low, which includes:  3-4 GLUCOSE TABS  OR 3-4 OZ OF JUICE OR REGULAR SODA OR ONE TUBE OF GLUCOSE GEL    STEP 2: RECHECK blood sugar in 15 MINUTES STEP 3: If your blood sugar is still low at the 15 minute recheck --> then, go back to STEP 1 and treat AGAIN with another 15 grams of carbohydrates.

## 2023-10-06 NOTE — Progress Notes (Signed)
Name: Debbie Hunt  Age/ Sex: 57 y.o., female   MRN/ DOB: [id number], 1966/11/09     PCP: Raliegh Ip, DO   Reason for Endocrinology Evaluation: Type 2 Diabetes Mellitus  Initial Endocrine Consultative Visit: 09/10/2020    PATIENT IDENTIFIER: [name] is a 56 y.o. female with a past medical history of T2DM and Dyslipidemia . The patient has followed with Endocrinology clinic since 09/10/2020 for consultative assistance with management of her diabetes.  DIABETIC HISTORY:  [name] was diagnosed with gestational diabetes in ~1995 then was diagnosed with T2DM in 2007. Her hemoglobin A1c has ranged from 7.0% in 2019, peaking at 10.3% in 2021.  On her initial visit to our clinic she had an A1c of 8.1 %. She was on Farxiga and Metformin, we stopped [name] due to recurrent yeast infection hx of about 6 months . Continue Metformin , started Glipizide and Rybelsus    Glipizide stopped 11/2020 with an A1c 6.0%     Father with hx of thyroid disease   SUBJECTIVE:   During the last visit (03/22/2023): A1c 5.7%      Today (10/06/2023): Ms. Flom is here for a follow up on diabetes management.  She checks her blood sugars 1 times daily. The patient has not had hypoglycemic episodes since the last clinic visit.  Patient is complaining of weight gain, and inability to lose the weight Denies nausea or vomiting  Had mild constipation but no diarrhea   She is on a low fat diet  HOME DIABETES REGIMEN:  Metformin 750 mg  daily   Rybelsus 14 mg daily     Statin: no ACE-I/ARB: yes    METER DOWNLOAD SUMMARY: unable to download  95 - 166 mg/dL   DIABETIC COMPLICATIONS: Microvascular complications:   Denies: CKD, neuropathy, retinopathy Last Eye Exam: Completed 03/24/2022  Macrovascular complications:   Denies: CAD, CVA, PVD   HISTORY:  Past Medical History:  Past Medical History:  Diagnosis Date   Diabetes mellitus without complication (HCC)     Hyperlipidemia    Hypertension    Past Surgical History:  Past Surgical History:  Procedure Laterality Date   albaltion     CHOLECYSTECTOMY     Social History:  reports that she has never smoked. She has never used smokeless tobacco. She reports that she does not drink alcohol and does not use drugs. Family History:  Family History  Problem Relation Age of Onset   Diabetes Mother    Hypertension Mother      HOME MEDICATIONS: Allergies as of 10/06/2023       Reactions   Codeine    Penicillins Hives        Medication List        Accurate as of October 06, 2023  8:32 AM. If you have any questions, ask your nurse or doctor.          STOP taking these medications    doxycycline 100 MG tablet Commonly known as: VIBRA-TABS Stopped by: Johnney Ou Berlinda Farve       TAKE these medications    Accu-Chek Guide test strip Generic drug: glucose blood Check BGs once daily E11.9   Accu-Chek Softclix Lancets lancets Use as instructed to check BGs daily. E11.9   acyclovir ointment 5 % Commonly known as: Zovirax Apply 1 Application topically every 6 (six) hours as needed. As directed as needed for cold sores   clobetasol cream 0.05 % Commonly known as: TEMOVATE APPLY TO AFFECTED AREAS  UP TO TWICE DAILY OR AS NEEDED-NOT TO FACE, GROIN, UNDERARMS   ketoconazole 2 % cream Commonly known as: NIZORAL SMARTSIG:1 Application Topical 1 to 2 Times Daily   lisinopril-hydrochlorothiazide 20-12.5 MG tablet Commonly known as: ZESTORETIC Take 1 tablet by mouth daily.   metFORMIN 750 MG 24 hr tablet Commonly known as: GLUCOPHAGE-XR Take 1 tablet (750 mg total) by mouth daily.   Rybelsus 14 MG Tabs Generic drug: Semaglutide Take 1 tablet (14 mg total) by mouth daily.   triamcinolone ointment 0.5 % Commonly known as: KENALOG Apply 1 Application topically 2 (two) times daily.         OBJECTIVE:   Vital Signs: BP 122/80 (BP Location: Left Arm, Patient Position:  Sitting, Cuff Size: Large)   Pulse (!) 54   Ht 5\' 7"  (1.702 m)   Wt 229 lb (103.9 kg)   LMP 09/26/2014   SpO2 94%   BMI 35.87 kg/m   Wt Readings from Last 3 Encounters:  10/06/23 229 lb (103.9 kg)  07/10/23 233 lb (105.7 kg)  05/17/23 226 lb (102.5 kg)     Exam: General: Pt appears well and is in NAD  Lungs: Clear with good BS bilat   Heart: RRR   Extremities: No pretibial edema.   Neuro: MS is good with appropriate affect, pt is alert and Ox3    DM foot exam: 03/22/2023    The skin of the feet is intact without sores or ulcerations. The pedal pulses are 2+ on right and 2+ on left. The sensation is intact to a screening 5.07, 10 gram monofilament bilaterally     DATA REVIEWED:  Lab Results  Component Value Date   HGBA1C 6.1 (A) 10/06/2023   HGBA1C 5.7 (A) 03/22/2023   HGBA1C 6.0 (A) 09/20/2022    Latest Reference Range & Units 10/06/23 09:05  Sodium 135 - 145 mEq/L 140  Potassium 3.5 - 5.1 mEq/L 4.0  Chloride 96 - 112 mEq/L 103  CO2 19 - 32 mEq/L 28  Glucose 70 - 99 mg/dL 161 (H)  BUN 6 - 23 mg/dL 16  Creatinine 0.96 - 0.45 mg/dL 4.09  Calcium 8.4 - 81.1 mg/dL 9.5  Alkaline Phosphatase 39 - 117 U/L 62  Albumin 3.5 - 5.2 g/dL 4.4  AST 0 - 37 U/L 17  ALT 0 - 35 U/L 12  Total Protein 6.0 - 8.3 g/dL 7.3  Total Bilirubin 0.2 - 1.2 mg/dL 0.8  GFR >91.47 mL/min 85.04  Total CHOL/HDL Ratio  4  Cholesterol 0 - 200 mg/dL 829  HDL Cholesterol >56.21 mg/dL 30.86  LDL (calc) 0 - 99 mg/dL 578 (H)  NonHDL  469.62  Triglycerides 0.0 - 149.0 mg/dL 95.2  VLDL 0.0 - 84.1 mg/dL 32.4  VITD 40.10 - 272.53 ng/mL 35.43  (H): Data is abnormally high   ASSESSMENT / PLAN / RECOMMENDATIONS:   1) Type 2 Diabetes Mellitus, Optimally controlled, Without complications - Most recent A1c of 6.1 %. Goal A1c < 7.0 %.    - A1c continues to be optimal  - Intolerant to [place] due to recurrent genital infections -We discussed option of switching to Ozempic or Mounjaro for weight loss  purposes, patient would like to think about this -No changes at this time  MEDICATIONS:  - Continue   Metformin 750 mg XR BID  - Continue Rybelsus 14 mg, 1 tablet before Breakfast   EDUCATION / INSTRUCTIONS: BG monitoring instructions: Patient is instructed to check her blood sugars 1 times a day, fasting. Call  [facility] if: BG persistently < 70 I reviewed the Rule of 15 for the treatment of hypoglycemia in detail with the patient. Literature supplied.     2) Diabetic complications:  Eye: Does not have known diabetic retinopathy.  Neuro/ Feet: Does not have known diabetic peripheral neuropathy .  Renal: Patient does not have known baseline CKD. She   is  on an ACEI/ARB at present.    3) Dyslipidemia:  -She was on statin therapy but developed myalgias years ago -LDL is above goal and worsening -Will encourage lifestyle changes with low-fat diet  Medication  Start rosuvastatin 5 mg daily   F/U in 6 months     Signed electronically by: Lyndle Herrlich, MD  [facility] Endocrinology  [facility] [address] [place] [place], [place] [address] Phone: [phone] FAX: [phone]   CC: Raliegh Ip, DO [address] [place] [address] Phone: [phone]  Fax: [phone]  Return to Endocrinology clinic as below: Future Appointments  Date Time Provider Department Center  07/12/2024  8:00 AM Raliegh Ip, DO [facility] None

## 2023-10-17 ENCOUNTER — Encounter: Payer: Self-pay | Admitting: Family Medicine

## 2023-10-24 ENCOUNTER — Encounter: Payer: Self-pay | Admitting: Family Medicine

## 2023-10-31 ENCOUNTER — Encounter: Payer: Self-pay | Admitting: Family Medicine

## 2023-11-06 ENCOUNTER — Encounter: Payer: Self-pay | Admitting: Family Medicine

## 2023-11-06 NOTE — Telephone Encounter (Signed)
 Care team updated and letter sent for eye exam notes.

## 2024-01-31 ENCOUNTER — Telehealth: Admitting: Physician Assistant

## 2024-01-31 DIAGNOSIS — H1013 Acute atopic conjunctivitis, bilateral: Secondary | ICD-10-CM

## 2024-01-31 MED ORDER — OLOPATADINE HCL 0.1 % OP SOLN: 1.0000 [drp] | Freq: Two times a day (BID) | OPHTHALMIC | 0 refills | Status: DC

## 2024-01-31 NOTE — Progress Notes (Signed)
 E-Visit for [employer]   We are sorry that you are not feeling well.  Here is how we plan to help!  Based on what you have shared with me it looks like you have conjunctivitis.  Conjunctivitis is a common inflammatory or infectious condition of the eye that is often referred to as "pink eye".  In most cases it is contagious (viral or bacterial). However, not all conjunctivitis requires antibiotics (ex. Allergic).  We have made appropriate suggestions for you based upon your presentation.  I recommend that you use Olopatadine 0.1% allergy eye drops Use 1 drop into each eye twice daily as needed. These have been sent to your local pharmacy on file.   Pink eye can be highly contagious.  It is typically spread through direct contact with secretions, or contaminated objects or surfaces that one may have touched.  Strict handwashing is suggested with soap and water is urged.  If not available, use alcohol based had sanitizer.  Avoid unnecessary touching of the eye.  If you wear contact lenses, you will need to refrain from wearing them until you see no white discharge from the eye for at least 24 hours after being on medication.  You should see symptom improvement in 1-2 days after starting the medication regimen.  Call us if symptoms are not improved in 1-2 days.  Home Care: Wash your hands often! Do not wear your contacts until you complete your treatment plan. Avoid sharing towels, bed linen, personal items with a person who has pink eye. See attention for anyone in your home with similar symptoms.  Get Help Right Away If: Your symptoms do not improve. You develop blurred or loss of vision. Your symptoms worsen (increased discharge, pain or redness)   Thank you for choosing an e-visit.  Your e-visit answers were reviewed by a board certified advanced clinical practitioner to complete your personal care plan. Depending upon the condition, your plan could have included both over the counter or  prescription medications.  Please review your pharmacy choice. Make sure the pharmacy is open so you can pick up prescription now. If there is a problem, you may contact your provider through [employer] and have the prescription routed to another pharmacy.  Your safety is important to [place]. If you have drug allergies check your prescription carefully.   For the next 24 hours you can use MyChart to ask questions about today's visit, request a non-urgent call back, or ask for a work or school excuse. You will get an email in the next two days asking about your experience. I hope that your e-visit has been valuable and will speed your recovery.   I have spent 5 minutes in review of e-visit questionnaire, review and updating patient chart, medical decision making and response to patient.   Margaretann Loveless, PA-C

## 2024-02-23 ENCOUNTER — Other Ambulatory Visit: Payer: Self-pay | Admitting: Family Medicine

## 2024-02-23 DIAGNOSIS — E1165 Type 2 diabetes mellitus with hyperglycemia: Secondary | ICD-10-CM

## 2024-03-01 ENCOUNTER — Other Ambulatory Visit: Payer: Self-pay | Admitting: Internal Medicine

## 2024-04-01 ENCOUNTER — Ambulatory Visit: Admitting: Family Medicine

## 2024-04-04 ENCOUNTER — Ambulatory Visit: Payer: BC Managed Care – PPO | Admitting: Internal Medicine

## 2024-04-11 ENCOUNTER — Ambulatory Visit: Payer: BC Managed Care – PPO | Admitting: Internal Medicine

## 2024-04-30 ENCOUNTER — Other Ambulatory Visit: Payer: Self-pay | Admitting: Family Medicine

## 2024-04-30 DIAGNOSIS — I152 Hypertension secondary to endocrine disorders: Secondary | ICD-10-CM

## 2024-05-13 ENCOUNTER — Encounter: Payer: Self-pay | Admitting: Family Medicine

## 2024-05-13 ENCOUNTER — Ambulatory Visit: Admitting: Family Medicine

## 2024-05-13 VITALS — BP 112/76 | HR 69 | Temp 98.8°F | Ht 67.0 in | Wt 232.6 lb

## 2024-05-13 DIAGNOSIS — T466X5A Adverse effect of antihyperlipidemic and antiarteriosclerotic drugs, initial encounter: Secondary | ICD-10-CM | POA: Insufficient documentation

## 2024-05-13 DIAGNOSIS — J3489 Other specified disorders of nose and nasal sinuses: Secondary | ICD-10-CM | POA: Diagnosis not present

## 2024-05-13 DIAGNOSIS — I152 Hypertension secondary to endocrine disorders: Secondary | ICD-10-CM

## 2024-05-13 DIAGNOSIS — E1159 Type 2 diabetes mellitus with other circulatory complications: Secondary | ICD-10-CM | POA: Diagnosis not present

## 2024-05-13 DIAGNOSIS — E785 Hyperlipidemia, unspecified: Secondary | ICD-10-CM

## 2024-05-13 DIAGNOSIS — M791 Myalgia, unspecified site: Secondary | ICD-10-CM

## 2024-05-13 DIAGNOSIS — N951 Menopausal and female climacteric states: Secondary | ICD-10-CM

## 2024-05-13 DIAGNOSIS — E1169 Type 2 diabetes mellitus with other specified complication: Secondary | ICD-10-CM

## 2024-05-13 DIAGNOSIS — E119 Type 2 diabetes mellitus without complications: Secondary | ICD-10-CM

## 2024-05-13 DIAGNOSIS — Z7984 Long term (current) use of oral hypoglycemic drugs: Secondary | ICD-10-CM

## 2024-05-13 MED ORDER — METFORMIN HCL ER 750 MG PO TB24: 750.0000 mg | ORAL_TABLET | Freq: Every day | ORAL | 4 refills | Status: DC

## 2024-05-13 MED ORDER — LISINOPRIL-HYDROCHLOROTHIAZIDE 20-12.5 MG PO TABS: 1.0000 | ORAL_TABLET | Freq: Every day | ORAL | 4 refills | Status: AC

## 2024-05-13 MED ORDER — AZELASTINE HCL 0.1 % NA SOLN: 1.0000 | Freq: Two times a day (BID) | NASAL | 12 refills | Status: AC

## 2024-05-13 MED ORDER — RYBELSUS 14 MG PO TABS: 1.0000 | ORAL_TABLET | Freq: Every day | ORAL | 4 refills | Status: DC

## 2024-05-13 NOTE — Progress Notes (Signed)
 Subjective: CC:DM PCP: Jolinda Norene HERO, DO YEP:Debbie Hunt is a 58 y.o. female presenting to clinic today for:  1. Type 2 Diabetes with hypertension, hyperlipidemia:  Managed by Endocrinology.  Last OV 09/2023, next OV 05/2024.  Started on Statin due to rising LDL.  She was not able to stick with the statin because of joint pain.  Patient reports compliance with Rybelsus , Zestoretic  and metformin . BGs have been controlled.  She still does indulge in occasional sweets but really tries to be conscientious about what she eats.  She is not exercising regularly.  She works from home and takes care of her 36-year-old grandson.  She is hoping to retire next year and then she can start putting her physical activity at the forefront.  Diabetes Health Maintenance Due  Topic Date Due   OPHTHALMOLOGY EXAM  03/25/2023   FOOT EXAM  03/21/2024   HEMOGLOBIN A1C  04/04/2024    Last A1c:  Lab Results  Component Value Date   HGBA1C 6.1 (A) 10/06/2023    ROS: No chest pain, shortness of breath or dizziness.  2.  Nasal irritation She reports soreness in the anterior nasal cavity bilaterally.  This is been ongoing since November when she had COVID.  She admits to rhinorrhea.  Only takes Benadryl if she needs it for itching but no oral antihistamines or nasal sprays otherwise.  ROS: Per HPI  Allergies  Allergen Reactions   Codeine    Penicillins Hives   Past Medical History:  Diagnosis Date   Diabetes mellitus without complication (HCC)    Hyperlipidemia    Hypertension     Current Outpatient Medications:    azelastine (ASTELIN) 0.1 % nasal spray, Place 1 spray into both nostrils 2 (two) times daily., Disp: 30 mL, Rfl: 12   Accu-Chek Softclix Lancets lancets, Use as instructed to check BGs daily. E11.9, Disp: 100 each, Rfl: 12   acyclovir  ointment (ZOVIRAX ) 5 %, Apply 1 Application topically every 6 (six) hours as needed. As directed as needed for cold sores, Disp: 30 g, Rfl: 0    clobetasol cream (TEMOVATE) 0.05 %, APPLY TO AFFECTED AREAS UP TO TWICE DAILY OR AS NEEDED-NOT TO FACE, GROIN, UNDERARMS, Disp: , Rfl:    glucose blood (ACCU-CHEK GUIDE) test strip, Check BGs once daily E11.9, Disp: 100 strip, Rfl: 3   ketoconazole (NIZORAL) 2 % cream, SMARTSIG:1 Application Topical 1 to 2 Times Daily, Disp: , Rfl:    lisinopril -hydrochlorothiazide  (ZESTORETIC ) 20-12.5 MG tablet, Take 1 tablet by mouth daily., Disp: 90 tablet, Rfl: 4   metFORMIN  (GLUCOPHAGE -XR) 750 MG 24 hr tablet, Take 1 tablet (750 mg total) by mouth daily., Disp: 180 tablet, Rfl: 4   Semaglutide  (RYBELSUS ) 14 MG TABS, Take 1 tablet (14 mg total) by mouth daily., Disp: 90 tablet, Rfl: 4   triamcinolone  ointment (KENALOG ) 0.5 %, Apply 1 Application topically 2 (two) times daily., Disp: 30 g, Rfl: 0 Social History   Socioeconomic History   Marital status: Married    Spouse name: Not on file   Number of children: Not on file   Years of education: Not on file   Highest education level: Some college, no degree  Occupational History   Not on file  Tobacco Use   Smoking status: Never   Smokeless tobacco: Never  Vaping Use   Vaping status: Never Used  Substance and Sexual Activity   Alcohol use: No   Drug use: No   Sexual activity: Yes  Other Topics Concern  Not on file  Social History Narrative   Not on file   Social Drivers of Health   Financial Resource Strain: Low Risk  (05/11/2024)   Overall Financial Resource Strain (CARDIA)    Difficulty of Paying Living Expenses: Not hard at all  Food Insecurity: No Food Insecurity (05/11/2024)   Hunger Vital Sign    Worried About Running Out of Food in the Last Year: Never true    Ran Out of Food in the Last Year: Never true  Transportation Needs: No Transportation Needs (05/11/2024)   PRAPARE - Administrator, Civil Service (Medical): No    Lack of Transportation (Non-Medical): No  Physical Activity: Unknown (05/11/2024)   Exercise Vital Sign     Days of Exercise per Week: Patient declined    Minutes of Exercise per Session: Not on file  Stress: No Stress Concern Present (05/11/2024)   [employer] of Occupational Health - Occupational Stress Questionnaire    Feeling of Stress: Only a little  Social Connections: Moderately Isolated (05/11/2024)   Social Connection and Isolation Panel    Frequency of Communication with Friends and Family: More than three times a week    Frequency of Social Gatherings with Friends and Family: Patient declined    Attends Religious Services: Patient declined    Database administrator or Organizations: No    Attends Engineer, structural: Not on file    Marital Status: Married  Catering manager Violence: Not on file   Family History  Problem Relation Age of Onset   Diabetes Mother    Hypertension Mother     Objective: Office vital signs reviewed. BP 112/76   Pulse 69   Temp 98.8 F (37.1 C)   Ht 5' 7 (1.702 m)   Wt 232 lb 9.6 oz (105.5 kg)   LMP 09/26/2014   SpO2 100%   BMI 36.43 kg/m   Physical Examination:  General: Awake, alert, well-appearing morbidly obese female, No acute distress HEENT: She has erythema and irritation of the anterior nasal cavity but no evidence of sores or other skin breakdown Cardio: regular rate and rhythm, S1S2 heard, no murmurs appreciated Pulm: clear to auscultation bilaterally, no wheezes, rhonchi or rales; normal work of breathing on room air  Assessment/ Plan: 58 y.o. female   Diabetes mellitus treated with oral medication (HCC) - Plan: Microalbumin / creatinine urine ratio, Semaglutide  (RYBELSUS ) 14 MG TABS, metFORMIN  (GLUCOPHAGE -XR) 750 MG 24 hr tablet, Bayer DCA Hb A1c Waived  Hypertension associated with diabetes (HCC) - Plan: lisinopril -hydrochlorothiazide  (ZESTORETIC ) 20-12.5 MG tablet, Semaglutide  (RYBELSUS ) 14 MG TABS  Hyperlipidemia associated with type 2 diabetes mellitus (HCC) - Plan: Semaglutide  (RYBELSUS ) 14 MG TABS,  CANCELED: Lipid Panel, CANCELED: CMP14+EGFR  Rhinorrhea - Plan: azelastine (ASTELIN) 0.1 % nasal spray  Check A1c, urine microalbumin.  I renewed her medications.  I am glad to take over the care of her diabetes as requested.  We talked about injectable GIP and GLP and their advantages.  I think we could probably reduce her pill burden if she wanted to go to an injectable but for now I think she is going to stay put with Rybelsus .  Blood pressure is controlled.  No changes  We will plan for fasting lipid at her physical in August.  May offer a coronary artery calcium  scoring should she desire  Astelin added for rhinorrhea.  Norene CHRISTELLA Fielding, DO [facility] [phone]

## 2024-05-16 ENCOUNTER — Other Ambulatory Visit: Payer: Self-pay

## 2024-06-12 ENCOUNTER — Ambulatory Visit: Admitting: Internal Medicine

## 2024-07-12 ENCOUNTER — Ambulatory Visit: Payer: Self-pay | Admitting: Family Medicine

## 2024-07-12 ENCOUNTER — Encounter: Payer: Self-pay | Admitting: Family Medicine

## 2024-07-12 ENCOUNTER — Ambulatory Visit (INDEPENDENT_AMBULATORY_CARE_PROVIDER_SITE_OTHER): Payer: BC Managed Care – PPO | Admitting: Family Medicine

## 2024-07-12 VITALS — BP 108/76 | HR 72 | Temp 97.6°F | Ht 67.0 in | Wt 230.4 lb

## 2024-07-12 DIAGNOSIS — Z7984 Long term (current) use of oral hypoglycemic drugs: Secondary | ICD-10-CM | POA: Diagnosis not present

## 2024-07-12 DIAGNOSIS — E559 Vitamin D deficiency, unspecified: Secondary | ICD-10-CM

## 2024-07-12 DIAGNOSIS — E119 Type 2 diabetes mellitus without complications: Secondary | ICD-10-CM

## 2024-07-12 DIAGNOSIS — Z0000 Encounter for general adult medical examination without abnormal findings: Secondary | ICD-10-CM

## 2024-07-12 DIAGNOSIS — E1159 Type 2 diabetes mellitus with other circulatory complications: Secondary | ICD-10-CM

## 2024-07-12 DIAGNOSIS — I152 Hypertension secondary to endocrine disorders: Secondary | ICD-10-CM | POA: Diagnosis not present

## 2024-07-12 DIAGNOSIS — E66813 Obesity, class 3: Secondary | ICD-10-CM

## 2024-07-12 DIAGNOSIS — E1169 Type 2 diabetes mellitus with other specified complication: Secondary | ICD-10-CM

## 2024-07-12 DIAGNOSIS — M791 Myalgia, unspecified site: Secondary | ICD-10-CM | POA: Diagnosis not present

## 2024-07-12 DIAGNOSIS — R232 Flushing: Secondary | ICD-10-CM

## 2024-07-12 DIAGNOSIS — Z0001 Encounter for general adult medical examination with abnormal findings: Secondary | ICD-10-CM

## 2024-07-12 DIAGNOSIS — E785 Hyperlipidemia, unspecified: Secondary | ICD-10-CM

## 2024-07-12 DIAGNOSIS — T466X5D Adverse effect of antihyperlipidemic and antiarteriosclerotic drugs, subsequent encounter: Secondary | ICD-10-CM

## 2024-07-12 LAB — BAYER DCA HB A1C WAIVED: HB A1C (BAYER DCA - WAIVED): 5.9 % — ABNORMAL HIGH (ref 4.8–5.6)

## 2024-07-12 MED ORDER — METFORMIN HCL 1000 MG PO TABS: 1000.0000 mg | ORAL_TABLET | Freq: Two times a day (BID) | ORAL | 4 refills | Status: AC

## 2024-07-12 NOTE — Progress Notes (Signed)
 Debbie Hunt is a 58 y.o. female presents to office today for annual physical exam examination.    Discussed the use of AI scribe software for clinical note transcription with the patient, who gave verbal consent to proceed.  History of Present Illness   Debbie Hunt is a 58 year old female who presents for an annual physical exam.  She is experiencing menopausal symptoms, including hot flashes, and has not had any bleeding for over five years.  She describes experiencing 'zaps' in her chest area, which are short-lived and not associated with any other symptoms. She has a family history of heart issues, as her father experienced heart problems before passing away.  She has been managing her diabetes with metformin . Previously on 1000 mg twice daily, she was switched to 750 mg extended release once daily last year, after which she noticed weight gain. She monitors her blood sugar levels regularly and notes fluctuations based on her dietary intake. She is currently taking Rybelsus , Glucophage  (metformin ), and Zestoretic , along with a daily gummy vitamin containing zinc and vitamin C.  She is experiencing significant life changes as she is losing her job of thirty years due to a company restructuring. She will be receiving a severance package and plans to transition to her husband's insurance. She plans to take time off and focus on her well-being, including joining a gym and spending more time with her grandchild.  No abnormal vaginal discharge, rectal bleeding, or breast concerns. She plans to have a Pap smear and mammogram at the end of next month.      Health Maintenance Due  Topic Date Due   HIV Screening  Never done   Hepatitis B Vaccines 19-59 Average Risk (1 of 3 - 19+ 3-dose series) Never done   Diabetic kidney evaluation - Urine ACR  05/04/2019   Pneumococcal Vaccine: 50+ Years (2 of 2 - PCV) 08/15/2020   OPHTHALMOLOGY EXAM  03/25/2023   FOOT EXAM  03/21/2024   HEMOGLOBIN  A1C  04/04/2024   Refills needed today: all  Immunization History  Administered Date(s) Administered   Influenza Whole 08/23/2010   Influenza,inj,Quad PF,6+ Mos 09/02/2013, 08/27/2014, 08/25/2015, 08/11/2016, 08/29/2017, 08/20/2018, 08/16/2019, 08/12/2020   Influenza-Unspecified 08/21/2012   Moderna Sars-Covid-2 Vaccination 04/03/2020, 05/08/2020, 11/06/2020   Pneumococcal Polysaccharide-23 08/16/2019   Tdap 08/12/2020   Past Medical History:  Diagnosis Date   Diabetes mellitus without complication (HCC)    Hyperlipidemia    Hypertension    Social History   Socioeconomic History   Marital status: Married    Spouse name: Not on file   Number of children: Not on file   Years of education: Not on file   Highest education level: Some college, no degree  Occupational History   Not on file  Tobacco Use   Smoking status: Never   Smokeless tobacco: Never  Vaping Use   Vaping status: Never Used  Substance and Sexual Activity   Alcohol use: No   Drug use: No   Sexual activity: Yes  Other Topics Concern   Not on file  Social History Narrative   Not on file   Social Drivers of Health   Financial Resource Strain: Low Risk  (05/11/2024)   Overall Financial Resource Strain (CARDIA)    Difficulty of Paying Living Expenses: Not hard at all  Food Insecurity: No Food Insecurity (05/11/2024)   Hunger Vital Sign    Worried About Running Out of Food in the Last Year: Never true  Ran Out of Food in the Last Year: Never true  Transportation Needs: No Transportation Needs (05/11/2024)   PRAPARE - Administrator, Civil Service (Medical): No    Lack of Transportation (Non-Medical): No  Physical Activity: Unknown (05/11/2024)   Exercise Vital Sign    Days of Exercise per Week: Patient declined    Minutes of Exercise per Session: Not on file  Stress: No Stress Concern Present (05/11/2024)   [employer] of Occupational Health - Occupational Stress Questionnaire     Feeling of Stress: Only a little  Social Connections: Moderately Isolated (05/11/2024)   Social Connection and Isolation Panel    Frequency of Communication with Friends and Family: More than three times a week    Frequency of Social Gatherings with Friends and Family: Patient declined    Attends Religious Services: Patient declined    Database administrator or Organizations: No    Attends Engineer, structural: Not on file    Marital Status: Married  Catering manager Violence: Not on file   Past Surgical History:  Procedure Laterality Date   [place]     CHOLECYSTECTOMY     Family History  Problem Relation Age of Onset   Diabetes Mother    Hypertension Mother     Current Outpatient Medications:    Accu-Chek Softclix Lancets lancets, Use as instructed to check BGs daily. E11.9, Disp: 100 each, Rfl: 12   glucose blood (ACCU-CHEK GUIDE) test strip, Check BGs once daily E11.9, Disp: 100 strip, Rfl: 3   lisinopril -hydrochlorothiazide  (ZESTORETIC ) 20-12.5 MG tablet, Take 1 tablet by mouth daily., Disp: 90 tablet, Rfl: 4   metFORMIN  (GLUCOPHAGE -XR) 750 MG 24 hr tablet, Take 1 tablet (750 mg total) by mouth daily., Disp: 180 tablet, Rfl: 4   Semaglutide  (RYBELSUS ) 14 MG TABS, Take 1 tablet (14 mg total) by mouth daily., Disp: 90 tablet, Rfl: 4   acyclovir  ointment (ZOVIRAX ) 5 %, Apply 1 Application topically every 6 (six) hours as needed. As directed as needed for cold sores (Patient not taking: Reported on 07/12/2024), Disp: 30 g, Rfl: 0   azelastine  (ASTELIN ) 0.1 % nasal spray, Place 1 spray into both nostrils 2 (two) times daily. (Patient not taking: Reported on 07/12/2024), Disp: 30 mL, Rfl: 12   clobetasol cream (TEMOVATE) 0.05 %, APPLY TO AFFECTED AREAS UP TO TWICE DAILY OR AS NEEDED-NOT TO FACE, GROIN, UNDERARMS (Patient not taking: Reported on 07/12/2024), Disp: , Rfl:    ketoconazole (NIZORAL) 2 % cream, SMARTSIG:1 Application Topical 1 to 2 Times Daily (Patient not taking:  Reported on 07/12/2024), Disp: , Rfl:    triamcinolone  ointment (KENALOG ) 0.5 %, Apply 1 Application topically 2 (two) times daily. (Patient not taking: Reported on 07/12/2024), Disp: 30 g, Rfl: 0  Allergies  Allergen Reactions   Codeine    Penicillins Hives     ROS: Review of Systems Pertinent items noted in HPI and remainder of comprehensive ROS otherwise negative.    Physical exam BP 108/76   Pulse 72   Temp 97.6 F (36.4 C)   Ht 5' 7 (1.702 m)   Wt 230 lb 6 oz (104.5 kg)   LMP 09/26/2014   SpO2 100%   BMI 36.08 kg/m  General appearance: alert, cooperative, appears stated age, no distress, and morbidly obese Head: Normocephalic, without obvious abnormality, atraumatic Eyes: negative findings: lids and lashes normal, conjunctivae and sclerae normal, corneas clear, pupils equal, round, reactive to light and accomodation, and wears glasses Ears: normal TM's  and external ear canals both ears Nose: Nares normal. Septum midline. Mucosa normal. No drainage or sinus tenderness. Throat: lips, mucosa, and tongue normal; teeth and gums normal Neck: no adenopathy, no carotid bruit, supple, symmetrical, trachea midline, and thyroid  not enlarged, symmetric, no tenderness/mass/nodules Back: symmetric, no curvature. ROM normal. No CVA tenderness. Lungs: clear to auscultation bilaterally Heart: regular rate and rhythm, S1, S2 normal, no murmur, click, rub or gallop Abdomen: soft, non-tender; bowel sounds normal; no masses,  no organomegaly Extremities: extremities normal, atraumatic, no cyanosis or edema Pulses: 2+ and symmetric Skin: Skin color, texture, turgor normal. No rashes or lesions Lymph nodes: Cervical, supraclavicular, and axillary nodes normal. Neurologic: Grossly normal      07/12/2024    8:22 AM 05/13/2024    8:21 AM 07/10/2023    1:42 PM  Depression screen PHQ 2/9  Decreased Interest 0 0 0  Down, Depressed, Hopeless 0 0 0  PHQ - 2 Score 0 0 0  Altered sleeping 1 0 0   Tired, decreased energy 0 0 0  Change in appetite 0 0 0  Feeling bad or failure about yourself  0 0 0  Trouble concentrating 0 0 0  Moving slowly or fidgety/restless 0 0 0  Suicidal thoughts 0 0 0  PHQ-9 Score 1 0 0  Difficult doing work/chores Not difficult at all Not difficult at all Not difficult at all      07/12/2024    8:23 AM 05/13/2024    8:21 AM 07/10/2023    1:42 PM 05/17/2023    9:38 AM  GAD 7 : Generalized Anxiety Score  Nervous, Anxious, on Edge 1 0 1 1  Control/stop worrying 1 0 0 0  Worry too much - different things 1 0 0 0  Trouble relaxing 1 0 1 0  Restless 0 0 0 0  Easily annoyed or irritable 1 0 0 0  Afraid - awful might happen 1 0 0 0  Total GAD 7 Score 6 0 2 1  Anxiety Difficulty Not difficult at all Not difficult at all Not difficult at all Not difficult at all     Assessment/ Plan: Debbie Hunt here for annual physical exam.   Annual physical exam  Diabetes mellitus treated with oral medication (HCC) - Plan: Microalbumin / creatinine urine ratio, Bayer DCA Hb A1c Waived, CMP14+EGFR, CBC, metFORMIN  (GLUCOPHAGE ) 1000 MG tablet  Hypertension associated with diabetes (HCC) - Plan: CMP14+EGFR  Hyperlipidemia associated with type 2 diabetes mellitus (HCC) - Plan: CMP14+EGFR, Lipid Panel, TSH, Lipoprotein A (LPA)  Myalgia due to statin - Plan: CMP14+EGFR, Lipoprotein A (LPA)  Obesity, Class III, BMI 40-49.9 (morbid obesity) - Plan: VITAMIN D  25 Hydroxy (Vit-D Deficiency, Fractures)  Hot flashes - Plan: FSH/LH  Plan:    Adult Wellness Visit Routine wellness visit with no significant changes in family history or new concerns. - Perform physical examination. - Schedule Pap and mammogram for next month. - Review Pap and mammogram results from [facility]. - declined PNA vaccine.  Menopausal symptoms Experiencing hot flashes, no bleeding for over five years. Hormonal status not previously assessed. - Add FSH and LH to blood work to assess  menopausal status.  Type 2 diabetes mellitus Weight gain after switch to metformin  ER 750 mg. Blood sugar elevated due to inadequate diet. Discussed benefits of returning to previous metformin  dosage. - Switch back to metformin  1000 mg twice daily. - Monitor blood sugar levels. - ROI for DM eye exam   Essential hypertension Blood  pressure well-controlled. Advised monitoring for symptoms with increased exercise. Discussed potential medication adjustment if blood pressure decreases. - Monitor blood pressure, especially with increased exercise. - Adjust medication if symptoms of low blood pressure occur.  Hyperlipidemia Cholesterol levels previously elevated. Lipoprotein A added to labs for genetic risk assessment. Discussed its role in cardiovascular risk stratification. - Order lipoprotein A and cholesterol levels. - Review lab results to assess cardiovascular risk.      Counseled on healthy lifestyle choices, including diet (rich in fruits, vegetables and lean meats and low in salt and simple carbohydrates) and exercise (at least 30 minutes of moderate physical activity daily).  Patient to follow up 58m for DM  Debbie Labell M. Jolinda, DO

## 2024-07-13 LAB — CBC
Hematocrit: 41.7 % (ref 34.0–46.6)
Hemoglobin: 13.3 g/dL (ref 11.1–15.9)
MCH: 28.1 pg (ref 26.6–33.0)
MCHC: 31.9 g/dL (ref 31.5–35.7)
MCV: 88 fL (ref 79–97)
Platelets: 241 x10E3/uL (ref 150–450)
RBC: 4.73 x10E6/uL (ref 3.77–5.28)
RDW: 13.4 % (ref 11.7–15.4)
WBC: 5.5 x10E3/uL (ref 3.4–10.8)

## 2024-07-13 LAB — MICROALBUMIN / CREATININE URINE RATIO
Creatinine, Urine: 19.8 mg/dL
Microalb/Creat Ratio: <15 mg/g{creat} (ref 0–29)
Microalbumin, Urine: <3 ug/mL

## 2024-07-13 LAB — FSH/LH
FSH: 53.7 m[IU]/mL
LH: 29.2 m[IU]/mL

## 2024-07-13 LAB — LIPID PANEL
Chol/HDL Ratio: 3.5 ratio (ref 0.0–4.4)
Cholesterol, Total: 174 mg/dL (ref 100–199)
HDL: 50 mg/dL (ref >39)
LDL Chol Calc (NIH): 103 mg/dL — ABNORMAL HIGH (ref 0–99)
Triglycerides: 119 mg/dL (ref 0–149)
VLDL Cholesterol Cal: 21 mg/dL (ref 5–40)

## 2024-07-13 LAB — CMP14+EGFR
ALT: 15 IU/L (ref 0–32)
AST: 21 IU/L (ref 0–40)
Albumin: 4.5 g/dL (ref 3.8–4.9)
Alkaline Phosphatase: 76 IU/L (ref 44–121)
BUN/Creatinine Ratio: 18 (ref 9–23)
BUN: 15 mg/dL (ref 6–24)
Bilirubin Total: 0.8 mg/dL (ref 0.0–1.2)
CO2: 22 mmol/L (ref 20–29)
Calcium: 9.4 mg/dL (ref 8.7–10.2)
Chloride: 100 mmol/L (ref 96–106)
Creatinine, Ser: 0.83 mg/dL (ref 0.57–1.00)
Globulin, Total: 2.5 g/dL (ref 1.5–4.5)
Glucose: 114 mg/dL — ABNORMAL HIGH (ref 70–99)
Potassium: 3.6 mmol/L (ref 3.5–5.2)
Sodium: 137 mmol/L (ref 134–144)
Total Protein: 7 g/dL (ref 6.0–8.5)
eGFR: 83 mL/min/1.73 (ref >59)

## 2024-07-13 LAB — LIPOPROTEIN A (LPA): Lipoprotein (a): 84.1 nmol/L — ABNORMAL HIGH (ref <75.0)

## 2024-07-13 LAB — TSH: TSH: 1.12 u[IU]/mL (ref 0.450–4.500)

## 2024-07-13 LAB — VITAMIN D 25 HYDROXY (VIT D DEFICIENCY, FRACTURES): Vit D, 25-Hydroxy: 20.5 ng/mL — ABNORMAL LOW (ref 30.0–100.0)

## 2024-07-15 ENCOUNTER — Encounter: Payer: Self-pay | Admitting: Family Medicine

## 2024-07-15 MED ORDER — VITAMIN D (ERGOCALCIFEROL) 1.25 MG (50000 UNIT) PO CAPS
50000.0000 [IU] | ORAL_CAPSULE | ORAL | 3 refills | Status: DC
Start: 2024-07-15 — End: 2024-07-15

## 2024-07-15 MED ORDER — VITAMIN D (ERGOCALCIFEROL) 1.25 MG (50000 UNIT) PO CAPS: 50000.0000 [IU] | ORAL_CAPSULE | ORAL | 3 refills | Status: AC

## 2024-07-23 NOTE — Telephone Encounter (Signed)
 2nd attempt to contact patient to give lab results. Left message for patient to call back. Looks like per lab result notes, PCP has questions/recommendations for patient that she needs answers/approval on.

## 2024-07-24 NOTE — Telephone Encounter (Signed)
 3rd attempt to contact patient to give lab results. Left another message.

## 2024-09-29 ENCOUNTER — Encounter: Payer: Self-pay | Admitting: Family Medicine

## 2024-10-10 DIAGNOSIS — Z1231 Encounter for screening mammogram for malignant neoplasm of breast: Secondary | ICD-10-CM | POA: Diagnosis not present

## 2024-10-10 DIAGNOSIS — Z01419 Encounter for gynecological examination (general) (routine) without abnormal findings: Secondary | ICD-10-CM | POA: Diagnosis not present

## 2024-11-04 ENCOUNTER — Encounter: Payer: Self-pay | Admitting: Family Medicine

## 2024-11-04 DIAGNOSIS — B001 Herpesviral vesicular dermatitis: Secondary | ICD-10-CM

## 2024-11-04 MED ORDER — ACYCLOVIR 5 % EX OINT: 1.0000 | TOPICAL_OINTMENT | CUTANEOUS | 0 refills | Status: AC | PRN

## 2024-11-04 NOTE — Telephone Encounter (Signed)

## 2024-11-12 ENCOUNTER — Encounter: Payer: Self-pay | Admitting: Family Medicine

## 2024-11-13 ENCOUNTER — Other Ambulatory Visit: Payer: Self-pay

## 2024-11-13 DIAGNOSIS — E1169 Type 2 diabetes mellitus with other specified complication: Secondary | ICD-10-CM

## 2024-11-13 DIAGNOSIS — I152 Hypertension secondary to endocrine disorders: Secondary | ICD-10-CM

## 2024-11-13 DIAGNOSIS — E119 Type 2 diabetes mellitus without complications: Secondary | ICD-10-CM

## 2024-11-13 MED ORDER — RYBELSUS 14 MG PO TABS: 1.0000 | ORAL_TABLET | Freq: Every day | ORAL | 0 refills | Status: AC

## 2024-11-19 ENCOUNTER — Ambulatory Visit: Payer: Self-pay | Admitting: Family Medicine

## 2024-11-26 ENCOUNTER — Telehealth: Payer: Self-pay | Admitting: Pharmacy Technician

## 2024-11-26 ENCOUNTER — Other Ambulatory Visit (HOSPITAL_COMMUNITY): Payer: Self-pay

## 2024-11-26 NOTE — Telephone Encounter (Signed)
Noted  -LS

## 2024-11-26 NOTE — Telephone Encounter (Signed)
 Pharmacy Patient Advocate Encounter   Received notification from Pt Calls Messages that prior authorization for Rybelsus  14MG  tablets is required/requested.   Insurance verification completed.   The patient is insured through [employer].   Per test claim: PA required; PA started via CoverMyMeds. KEY [id number] . Waiting for clinical questions to populate.

## 2024-11-26 NOTE — Telephone Encounter (Signed)
 PA request has been Started. New Encounter has been or will be created for follow up. For additional info see Pharmacy Prior Auth telephone encounter from 11/26/24.

## 2024-11-28 ENCOUNTER — Other Ambulatory Visit (HOSPITAL_COMMUNITY): Payer: Self-pay

## 2024-11-28 NOTE — Telephone Encounter (Signed)
 Pharmacy Patient Advocate Encounter  Received notification from [employer] that Prior Authorization for Rybelsus  14MG  tablets has been APPROVED from 11/28/24 to 11/28/25. Ran test claim, Copay is $150.00 for 3 months. This test claim was processed through [facility]- copay amounts may vary at other pharmacies due to pharmacy/plan contracts, or as the patient moves through the different stages of their insurance plan.   PA #/Case ID/Reference #: [id number]

## 2025-07-21 ENCOUNTER — Encounter: Payer: Self-pay | Admitting: Family Medicine
